# Patient Record
Sex: Male | Born: 1940 | Race: Black or African American | Hispanic: No | Marital: Married | Smoking: Former smoker
Health system: Southern US, Community
[De-identification: ages and names within clinical notes are randomized; demographics above are authoritative.]

## PROBLEM LIST (undated history)

## (undated) ENCOUNTER — Emergency Department (HOSPITAL_COMMUNITY): Payer: Self-pay

## (undated) DIAGNOSIS — E785 Hyperlipidemia, unspecified: Secondary | ICD-10-CM

## (undated) DIAGNOSIS — E119 Type 2 diabetes mellitus without complications: Secondary | ICD-10-CM

## (undated) DIAGNOSIS — R519 Headache, unspecified: Secondary | ICD-10-CM

## (undated) DIAGNOSIS — R55 Syncope and collapse: Secondary | ICD-10-CM

## (undated) DIAGNOSIS — G459 Transient cerebral ischemic attack, unspecified: Secondary | ICD-10-CM

## (undated) DIAGNOSIS — I10 Essential (primary) hypertension: Secondary | ICD-10-CM

---

## 1999-05-28 ENCOUNTER — Emergency Department (HOSPITAL_COMMUNITY): Admission: EM | Admit: 1999-05-28 | Discharge: 1999-05-28 | Payer: Self-pay | Admitting: Internal Medicine

## 1999-06-11 ENCOUNTER — Encounter: Payer: Self-pay | Admitting: Orthopedic Surgery

## 1999-06-11 ENCOUNTER — Ambulatory Visit (HOSPITAL_COMMUNITY): Admission: RE | Admit: 1999-06-11 | Discharge: 1999-06-11 | Payer: Self-pay | Admitting: Orthopedic Surgery

## 2000-06-23 ENCOUNTER — Encounter: Payer: Self-pay | Admitting: Emergency Medicine

## 2000-06-23 ENCOUNTER — Emergency Department (HOSPITAL_COMMUNITY): Admission: EM | Admit: 2000-06-23 | Discharge: 2000-06-23 | Payer: Self-pay | Admitting: Emergency Medicine

## 2000-10-08 ENCOUNTER — Encounter: Payer: Self-pay | Admitting: Family Medicine

## 2000-10-08 ENCOUNTER — Encounter: Admission: RE | Admit: 2000-10-08 | Discharge: 2000-10-08 | Payer: Self-pay | Admitting: Family Medicine

## 2001-11-23 ENCOUNTER — Encounter: Admission: RE | Admit: 2001-11-23 | Discharge: 2002-02-21 | Payer: Self-pay | Admitting: Orthopedic Surgery

## 2002-06-24 ENCOUNTER — Encounter: Admission: RE | Admit: 2002-06-24 | Discharge: 2002-06-24 | Payer: Self-pay | Admitting: Family Medicine

## 2002-06-24 ENCOUNTER — Encounter: Payer: Self-pay | Admitting: Family Medicine

## 2003-09-19 ENCOUNTER — Encounter: Admission: RE | Admit: 2003-09-19 | Discharge: 2003-12-18 | Payer: Self-pay | Admitting: Family Medicine

## 2004-03-08 ENCOUNTER — Inpatient Hospital Stay (HOSPITAL_COMMUNITY): Admission: RE | Admit: 2004-03-08 | Discharge: 2004-03-12 | Payer: Self-pay | Admitting: Orthopedic Surgery

## 2005-05-25 ENCOUNTER — Emergency Department (HOSPITAL_COMMUNITY): Admission: EM | Admit: 2005-05-25 | Discharge: 2005-05-25 | Payer: Self-pay | Admitting: Emergency Medicine

## 2006-05-02 ENCOUNTER — Encounter: Admission: RE | Admit: 2006-05-02 | Discharge: 2006-05-02 | Payer: Self-pay | Admitting: Family Medicine

## 2007-04-29 ENCOUNTER — Inpatient Hospital Stay (HOSPITAL_COMMUNITY): Admission: EM | Admit: 2007-04-29 | Discharge: 2007-05-01 | Payer: Self-pay | Admitting: Emergency Medicine

## 2007-04-30 ENCOUNTER — Encounter (INDEPENDENT_AMBULATORY_CARE_PROVIDER_SITE_OTHER): Payer: Self-pay | Admitting: Internal Medicine

## 2008-01-17 ENCOUNTER — Encounter: Admission: RE | Admit: 2008-01-17 | Discharge: 2008-01-17 | Payer: Self-pay | Admitting: Internal Medicine

## 2009-05-11 ENCOUNTER — Emergency Department (HOSPITAL_COMMUNITY): Admission: EM | Admit: 2009-05-11 | Discharge: 2009-05-11 | Payer: Self-pay | Admitting: Emergency Medicine

## 2010-11-30 LAB — POCT I-STAT, CHEM 8
BUN: 10 mg/dL (ref 6–23)
Hemoglobin: 13.3 g/dL (ref 13.0–17.0)
Potassium: 3.7 mEq/L (ref 3.5–5.1)
Sodium: 138 mEq/L (ref 135–145)
TCO2: 23 mmol/L (ref 0–100)

## 2010-11-30 LAB — CBC
Hemoglobin: 12.3 g/dL — ABNORMAL LOW (ref 13.0–17.0)
MCHC: 33.1 g/dL (ref 30.0–36.0)
RBC: 4.26 MIL/uL (ref 4.22–5.81)
WBC: 3.2 10*3/uL — ABNORMAL LOW (ref 4.0–10.5)

## 2010-11-30 LAB — URINALYSIS, ROUTINE W REFLEX MICROSCOPIC
Glucose, UA: 250 mg/dL — AB
Protein, ur: NEGATIVE mg/dL
Specific Gravity, Urine: 1.007 (ref 1.005–1.030)

## 2010-11-30 LAB — DIFFERENTIAL
Basophils Relative: 0 % (ref 0–1)
Lymphocytes Relative: 33 % (ref 12–46)
Lymphs Abs: 1.1 10*3/uL (ref 0.7–4.0)
Monocytes Relative: 10 % (ref 3–12)
Neutro Abs: 1.7 10*3/uL (ref 1.7–7.7)
Neutrophils Relative %: 54 % (ref 43–77)

## 2010-11-30 LAB — POCT CARDIAC MARKERS
CKMB, poc: <1 ng/mL — ABNORMAL LOW (ref 1.0–8.0)
Myoglobin, poc: 68.9 ng/mL (ref 12–200)

## 2010-11-30 LAB — BASIC METABOLIC PANEL
Calcium: 9.3 mg/dL (ref 8.4–10.5)
GFR calc non Af Amer: >60 mL/min (ref >60)
Potassium: 3.7 mEq/L (ref 3.5–5.1)
Sodium: 139 mEq/L (ref 135–145)

## 2011-01-08 NOTE — H&P (Signed)
NAMESEVE, MONETTE               ACCOUNT NO.:  [id number]   MEDICAL RECORD NO.:  [id number]          PATIENT TYPE:  EMS   LOCATION:  MAJO                         FACILITY:  [facility]   PHYSICIAN:  Elliot Cousin, M.D.    DATE OF BIRTH:  September 16, 1940   DATE OF ADMISSION:  04/29/2007  DATE OF DISCHARGE:                              HISTORY & PHYSICAL   PRIMARY CARE PHYSICIAN:  Unassigned.   CHIEF COMPLAINT:  Abdominal pain, nausea and vomiting.   HISTORY OF PRESENT ILLNESS:  The patient is a 70 year old man with a  past medical history significant for type 2 diabetes mellitus and  hyperlipidemia who presents to the emergency department with a 4-day  history of progressive abdominal pain, nausea and vomiting.  When his  symptoms started, the location of the pain was at the bottom of my  stomach.  He describes the pain as a hurting pain.  The pain  initially was intermittent however, it has been constant over the past  24 hours.  He rates the pain as an 8 or 9 over 10 in intensity at its  worst.  He has had at least two episodes of nausea and vomiting.  He  denies coffee ground emesis or bright red blood in his emesis.  Prior to  today he has eaten very little.  He actually started feeling a little  bit better last night however, during the course of the day he  experienced severe abdominal pain.  He has been able to eat a small  amount of solids and some liquids.  He denies any associated diarrhea,  constipation or painful urination.  No history of bright red blood per  rectum or black tarry stools.  The patient denies fever and chills. He  denies alcohol use or any known history of gallbladder disease.  He  denies any new recent medications.   During the evaluation in the emergency department, the patient is noted  to be bradycardic with a heart rate of 50 beats per minute and otherwise  hemodynamically stable.  He is afebrile.  His lab data are significant  for a lipase of 95 and a  venous glucose of 313.  CT scan of the abdomen  and pelvis reveals no acute abdominal process, left renal calculous non  obstructive, 5 mm, trace of free fluid in the pelvis, and tiny left  inguinal hernia containing only fat.  The patient will be admitted for  further evaluation and management.   PAST MEDICAL HISTORY:  1. Type 2 diabetes mellitus.  2. Hyperlipidemia.  3. 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  4. Postoperative hemorrhage in July of 2005.  5. Excision of a bone spur from the right leg in the past.  6. Right hip pain, results of the MRI as ordered by orthopedic      surgeon, Dr. Renae Fickle, a few days ago are pending.   MEDICATIONS:  1. Glucotrol XL 5 mg daily.  2. Metformin (question dose) 500 mg daily.  3. Lipitor (question dose) once daily.   ALLERGIES:  No known drug allergies.  SOCIAL HISTORY:  The patient is married.  [place]  He is retired from his primary occupation, however,  currently he works part time as a Arts administrator.  He denies alcohol,  tobacco and illicit drug use.  He can read and write a little.   FAMILY HISTORY:  His mother died of Alzheimer's disease in her 7's. His  father died of a stroke at 44 years of age.   REVIEW OF SYSTEMS:  The patient's review of systems is positive for  chronic right hip pain, otherwise review of systems is unremarkable.   PHYSICAL EXAMINATION:  VITAL SIGNS:  Temperature 97.1, blood pressure  122/73, pulse ranging from 50 to 60 beats per minute, respiratory rate  18, oxygen saturation 99% on room air.  GENERAL:  The patient is a pleasant 70 year old African-American man who  is currently sitting up in bed in no acute distress.  HEENT:  Head is normocephalic, atraumatic. Pupils are equal, round and  reactive to light. Extraocular movements are intact. Conjunctivae are  clear, sclerae are white.  Tympanic membranes not examined.  Nasal  mucosa mildly dry, no  sinus tenderness. Oropharynx reveals multiple  missing teeth. Mucous membranes are mildly dry.  No posterior exudates  or erythema.  NECK:  Supple, no adenopathy, no thyromegaly, no bruit, no JVD.  LUNGS:  Clear to auscultation bilaterally.  HEART:  S1, S2 with borderline bradycardia and a soft systolic murmur.  ABDOMEN:  Hypoactive bowel sounds, soft, mild to moderately tender in  the epigastrium and the left lower quadrant. No rebound, no guarding, no  distention, no rigidity.  RECTAL/GU:  Are deferred.  EXTREMITIES:  Pedal pulses palpable.  No pretibial edema and no pedal  edema. Range of motion of the hips is excellent although there is some  discomfort with the right hip internal and external rotations.  NEUROLOGIC:  The patient is alert and oriented x3. Cranial nerves II-XII  are intact. Strength is 5/5 throughout.  Sensation is intact.   ADMISSION LABORATORIES:  EKG reveals sinus bradycardia with a heart rate  of 48 beats per minute with nonspecific ST abnormalities (EKG in  September of 2006 reveals a heart rate of 59 beats per minute and sinus  bradycardia).  Creatinine 1, sodium 135, potassium 3.7, chloride 101,  BUN 17, glucose 313, bicarbonate 28.  WBC 4200, hemoglobin 12.5, platelets 145,000.  Urinalysis: Glucose  greater than 1000, leukocyte esterase negative, wbc's 0-2. AST 19, ALT  23, total protein 6.5, calcium 8.9, lipase 95.   ASSESSMENT:  1. Abdominal pain, nausea and vomiting.  More than likely the      patient's symptoms are secondary to acute pancreatitis.  The      patient's lipase is 95, mild to moderately elevated.  He does not      abuse alcohol and there were no obvious gallstones on the CT scan      of abdomen and pelvis.  The acute pancreatitis may be idiopathic or      secondary to medications.  Will need to rule out      hypertriglyceridemia.  2. Uncontrolled type 2 diabetes mellitus.  3. Normocytic anemia and thrombocytopenia. Etiology unknown at  this      time.  The patient denies bright red blood per rectum and melena.  4. Sinus bradycardia.  The patient has no complaints of chest pain.  A      2D echocardiogram will be ordered for further evaluation.  In  addition, a TSH will be ordered to rule out hypothyroidism.   PLAN:  1. The patient will be admitted for further evaluation and management.  2. He will be made virtually n.p.o. Will allow only sips of water and      ice chips occasionally.  3. Supportive care, IV fluids, pain management with IV Dilaudid as      needed.  4. Prophylactic Protonix and Lovenox.  Will monitor the patient's      platelets on Lovenox.  Will discontinue Lovenox if his platelet      count falls significantly.  5. Check iron studies and guaiac stools.  6. Check cardiac enzymes, TSH, 2D echocardiogram and a fasting lipid      panel.  7. Further evaluation with an ultrasound of the abdomen to rule out      gallstones/gallbladder disease.      Elliot Cousin, M.D.  Electronically Signed     DF/MEDQ  D:  04/29/2007  T:  04/29/2007  Job:  [id number]

## 2011-01-08 NOTE — Discharge Summary (Signed)
[name], [name]               ACCOUNT NO.:  [id number]   MEDICAL RECORD NO.:  [id number]          PATIENT TYPE:  INP   LOCATION:  3705                         FACILITY:  [facility]   PHYSICIAN:  Isidor Holts, M.D.  DATE OF BIRTH:  1941/05/06   DATE OF ADMISSION:  04/29/2007  DATE OF DISCHARGE:  05/01/2007                               DISCHARGE SUMMARY   PMD:  Unassigned.   DISCHARGE DIAGNOSES:  1. Acute pancreatitis.  2. Type 2 diabetes mellitus.  3. Dyslipidemia.  4. Sinus bradycardia.  5. Nonobstructive left renal calculus on abdominal CT scan, April 29, 2007.   DISCHARGE MEDICATIONS:  1. Glucotrol XL 5 mg p.o. daily.  2. Metformin 500 mg p.o. daily.  3. Lipitor 10 mg p.o. daily.  4. Prilosec OTC 20 mg daily for one week only.  5. Darvocet N 100 one p.o. p.r.n. q. 6 hours.  He took 12 of 20 pills      in the past.   PROCEDURES:  1. Abdominopelvic CT scan, dated April 29, 2007.  This showed no      acute abdominal process.  There was a left nonobstructive, 5 mm      renal calculus, free fluid of indeterminate etiology, otherwise no      acute pelvic process.  2. Abdominal ultrasound scan, dated April 30, 2007.  This was a      negative abdominal ultrasound scan.  3. 2D echocardiogram, dated April 30, 2007.  This showed overall      vigorous left ventricular systolic function, LVEF 70%, no left      ventricular regional wall motion abnormalities.  There was an      increased relative contribution of atrial contraction to left      ventricular filling, mild mitral valvular regurgitation.   CONSULTATIONS:  None.   ADMISSION HISTORY:  As in H and P note of April 29, 2007, dictated by  Dr. Elliot Cousin.  However, in brief, this is a 70 year old male, with  known history of type 2 diabetes mellitus, dyslipidemia, osteoarthritis,  status post left total knee replacement, July 2005, who presents with a  four-day history of progressive abdominal pain,  nausea and vomiting.  On  initial evaluation, he was found to have a serum lipase of 95 and blood  glucose of 313.  He was admitted for further evaluation, investigation  and management.   CLINICAL COURSE:  1. Acute pancreatitis:  For details of presentation, refer to      admission history, above.  As noted above, the patient's initiall      lipase level was 95.  He was managed with bowel rest, proton pump      inhibitor treatment, intravenous fluid hydration, and, by April 30, 2007, his lipase level was down to 25, and he had no further      abdominal pain or vomiting.  He was, therefore, advanced to clear      liquids with gradual advancement of diet, by evening of April 30, 2007, was  able to tolerate low-fat diet.  He remained      asymptomatic thereafter.  On May 01, 2007, lipase level was 20      and patient was completely asymptomatic.  Of note, lipid profile      showed no evidence of hypertriglyceridemia and abdominal ultrasound      scan, as well as abdominal CT scan, showed no evidence of      cholelithiasis.  The precise etiology of patient's acute      pancreatitis is unknown.   1. Uncontrolled type 2 diabetes mellitus:  As noted, on initial      presentation, the patient's blood glucose was 313.  This was      managed with sliding scale insulin coverage during the course of      his hospitalization and remained euglycemic.  He has been continued      on pre-admission Glucotrol XL and Glucophage, as well as      carbohydrate modified diet.   1. Sinus bradycardia:  At the time of presentation, patient was noted      to have a sinus bradycardia of 50 per minute, no associated chest      pain.  Electrocardiogram showed no acute ischemic changes.  Cardiac      enzymes were cycled, and remained unelevated.  A 2D echocardiogram      showed vigorous left ventricular function and there was no wall      motion abnormality.  Patient was, therefore, reassured  accordingly.      Incidentally, his TSH was normal at 1.108.   1. Left renal calculus:  This was an incidental finding on abdominal      CT scan of April 29, 2007, which showed a nonobstructive left      renal calculus of 5 mm.  Patient was asymptomatic from this      viewpoint.   1. Dyslipidemia:  The patient's lipid profile was as follows.  Total      cholesterol 183, triglyceride 89, HDL 59, LDL 106, i.e. excellent      lipid profile.  Patient has been reassured accordingly.  He is to      continue pre-admission Lipitor.   DISPOSITION:  Patient was considered sufficiently clinically recovered  and stable to be discharged on April 30, 2007.   DIET:  Low heart-rate modified diet.  Also low fat diet for the next one  week, after which it may be broadened.   FOLLOWUP INSTRUCTIONS:  Patient is recommended to establish primary M.D.  for routine and preventative care.      Isidor Holts, M.D.  Electronically Signed     CO/MEDQ  D:  05/01/2007  T:  05/01/2007  Job:  [id number]

## 2011-01-11 NOTE — Op Note (Signed)
NAME:  [name], [name]                         ACCOUNT NO.:  [id number]   MEDICAL RECORD NO.:  [id number]                   PATIENT TYPE:  INP   LOCATION:  2899                                 FACILITY:  [facility]   PHYSICIAN:  Deidre Ala, M.D.                 DATE OF BIRTH:  02-05-1941   DATE OF PROCEDURE:  03/08/2004  DATE OF DISCHARGE:                                 OPERATIVE REPORT   PREOPERATIVE DIAGNOSIS:  End-stage degenerative joint disease, left knee.   POSTOPERATIVE DIAGNOSIS:  End-stage degenerative joint disease, left knee.   PROCEDURE:  Left total knee arthroplasty using cemented DePuy components,  rotating-platform LCS type.   SURGEON:  Bradley Ferris, M.D.   ASSISTANT:  Madilyn Fireman, P.A.-C.   ANESTHESIA:  General endotracheal with femoral nerve block.   CULTURES:  None.   DRAINS:  Two medium Hemovacs to Autovac.   ESTIMATED BLOOD LOSS:  Less than 100 mL.   BLOOD REPLACED:  Without.   TOURNIQUET TIME:  1 hour 3 minutes.   PATHOLOGIC FINDINGS AND HISTORY:  Amare has been followed for nine months  for bilateral knee osteoarthritis.  He had pain without catching.  He was  not bone on bone but failed Hyalgan and began to have pain with every step,  night pain, and could not walk more than a city block.  Left was worse than  the right.  At surgery today, tricompartment disease.  We fit him to a large  left femur, tibial tray was #5, rotating platform was 12.5, a size large  with equal flexion and extension gap, and a 41 mm patellar button.  He had a  slight flexion contracture preoperatively.  He came to full extension  postoperatively and flexion to about 115 degrees, stable to stressing, with  good patellar tracking postoperatively.   PROCEDURE:  Adequate anesthesia obtained using endotracheal technique after  a femoral nerve block, 1 g Ancef given prophylaxis.  The patient was placed  in the supine position.  The left lower extremity was prepped from the  toes  to the tourniquet in the standard fashion.  After standard prepping and  draping, Esmarch exsanguination was used, and the tourniquet was let up to  350 mmHg.  A median parapatellar skin incision followed by a median  parapatellar retinacular incision.  Incision was deepened sharply with  adequate hemostasis obtained using the Bovie electrocoagulator.  We then  excised the fat pad, both cruciates, and the menisci.  The patella was  everted and the knee flexed.  We then amputated the tibial spine.  A tibial  cutting jig was put in place and tibial cut made with the appropriate  alignment.  We then sized to a large femur, placed the anterior-posterior  cutting jig in place with the 12.5 C-spanner.  We made our anterior-  posterior cut and fit the 12.5 lollipop in place.  We then  put the 4 degree  valgus cutting jig in place, made the distal cut.  It was a little tight for  the 12.5, so I cut 2.5 more and then fit 12.5 on flexion and extension.  Anterior-posterior, chamfer cutting jig was then put in place, then those  cuts made, as well as a far posterior cut.  We then exposed the proximal  tibia, sized to a 5, made the central peg hole and the keel broach.  We then  placed a trial 12.5 tray and the femoral component on and articulated the  knee through a full range of motion.  The patella was then measured to a 30.  We then cut it down with the guide and some free-hand to an 18.5 and then  placed the 41 mm drill guide in place, made those holes, and then placed the  41 mm patellar button and with good tracking noted.  All trial components  were then removed.  The knee was thoroughly jet-lavaged while the components  were checked for sizing as they came on the field.  Cement was then mixed  with two batches of Zinacef, two batches of cement into a cement gun.  We  then cemented on the tibial component, impacted it, and removed excess  cement.  Tibial tray was then put in place.  We then  cemented on the femoral  component, impacted it, removed excess cement, and held it in full  extension, removed excess cement, and then held it in 45 degrees of flexion  while the cement cured.  We then cemented on the patellar component,  impacted it, removed excess cement, and held it with the compression device  until the cement was cured.  Additional jet lavage was carried out.  We then  articulated the knee through a range of motion with good patellar tracking.  The tourniquet was let down, bleeding points were cauterized.  Hemovac  drains were then placed in the medial and lateral gutter and brought out the  superior lateral portal.  The wound was then closed with interrupted #1  Vicryl on the retinaculum, 0 and 2-0 Vicryl on the subcu, and skin staples.  A bulky sterile compressive dressing was applied with knee immobilizer and  the patient then, having tolerated the procedure well, was awakened, taken  to the recovery room in satisfactory condition for routine postoperative  care and CPM.                                               Deidre Ala, M.D.    VEP/MEDQ  D:  03/08/2004  T:  03/08/2004  Job:  [id number]   cc:   Charlynne Pander. Bruna Potter., M.D.

## 2011-01-11 NOTE — Discharge Summary (Signed)
NAME:  [name], [name]                         ACCOUNT NO.:  [id number]   MEDICAL RECORD NO.:  [id number]                   PATIENT TYPE:  INP   LOCATION:  5024                                 FACILITY:  [facility]   PHYSICIAN:  Deidre Ala, M.D.                 DATE OF BIRTH:  [birth year]   DATE OF ADMISSION:  03/08/2004  DATE OF DISCHARGE:  03/12/2004                                 DISCHARGE SUMMARY   ADMISSION DIAGNOSES:  1. Osteoarthritis of the left knee.  2. Diabetes mellitus.  3. Hypercholesterolemia.   DISCHARGE DIAGNOSES:  1. Osteoarthritis of the left knee, status post left total knee     arthroplasty.  2. Diabetes mellitus.  3. Hypercholesterolemia.  4. Postoperative hemorrhage with anemia stable at the time of discharge.   PROCEDURE:  The patient was taken to the operating room on March 08, 2004 and  underwent a left total knee arthroplasty.  Surgeon was Dr. Deidre Ala.  Assistant was Madilyn Fireman, P.A.-C.  Surgery was done under general anesthesia  and 2 Hemovac drains were placed at the time of surgery.   CONSULTS:  1. Physical therapy.  2. Occupational therapy.  3. Social Work.  4. Case Management.   BRIEF HISTORY:  The patient is a 70 year old male who does have a  longstanding history of left knee pain.  He has previously undergone all  conservative management and this has failed, including injections.  Radiographs from the office revealed end-stage degenerative joint disease of  the left knee and upon these findings, Dr. Renae Fickle felt it was best to proceed  with a left total knee arthroplasty; the patient agrees.  The risks and  benefits of the surgery were discussed with the patient and the patient  wishes to proceed.   LABORATORY DATA:  CBC on admission showed a hemoglobin of 12.5, hematocrit  36.6, white blood cell count 3.4, red blood cell count 4.28.  Serial  hemoglobins and hematocrits were followed throughout hospital stay;  hemoglobin and hematocrit  did climb to 7.3 and 21.6 on March 11, 2004,  however, at the time of discharge, they were 9.4 and 27.0 and stable, status  post 2 units of packed red blood cells.  Differential on admission was all  within normal limits.  Coagulation studies on admission were all within  normal limits.  PT/INR at the time of discharge were 14.0 and 1.1,  respectively, on Coumadin therapy.  Routine chemistry on admission was all  within normal limits.  Serial chemistries were taken throughout hospital  stay.  Glucose ranged from a low of 90 at the time of admission to a high of  248, on March 11, 2004, fell back to 223, but remained high and stable on  March 12, 2004, at the time of discharge.  Urinalysis on admission was all  within normal limits.  The patient's blood type is O-positive with antibody  screen negative.   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   HOSPITAL COURSE:  The patient was admitted to [facility] and taken  to the operating room.  Patient underwent the above-stated procedure without  complications.  The patient tolerated the procedure well and allowed to  return to recovery room and orthopedic floor to continue postoperative care.  Postoperative day #1, the patient was doing well, had a T-max of 100,  hemoglobin and hematocrit of 9.6 and 28.2.  The patient was to work with  physical therapy and occupational therapy.  On March 10, 2004, postop day #2,  the patient was doing well, T-max was 99.6, hemoglobin was 8.1, hematocrit  23.7.  Incision was clean, dry and intact.  He was neurovascularly intact to  the left lower extremity.  Hemovac was discontinued on this date; PCA was  also discontinued.  The patient was to continue to work on physical therapy  and occupational therapy.  On March 11, 2004, postop day #3, the patient was  up in chair, doing well.  T-max was 101.3.  Hemoglobin was 7.3 and  hematocrit 21.6, and he was neurovascularly intact to  the left lower  extremity.  He will be transfused 2 units of packed red blood cells on this  date.  He is to continue working with physical therapy and occupational  therapy.  On March 11, 2004, postoperative day #4, the patient was doing  well, was ready for discharge, was neurovascularly intact to the left lower  extremity.  Incision was clean, dry and intact.  The patient was to be  discharged home on this date.   DISPOSITION:  The patient was discharged home on March 12, 2004.   DISCHARGE MEDICATIONS:  1. OxyContin 10 mg 1 p.o. q.12 h. for 1 week.  2. Percocet 5/325 mg 1-2 p.o. q.4-6 h. p.r.n., #50.  3. Robaxin 750 mg 1 p.o. q.6-8 h. p.r.n. spasm.  4. Coumadin per pharmacy protocol.   DIET:  Low-carb, modified diabetic diet.   ACTIVITY:  The patient is weightbearing as tolerated to the left lower  extremity.   WOUND CARE:  Patient to perform daily dressing changes until no drainage.  He may shower when no drainage.   FOLLOWUP:  The patient is to follow up with Dr. Renae Fickle the 27th of July, call  the office for an appointment.   CONDITION ON DISCHARGE:  Condition on discharge is stable and improved.      Clarene Reamer, P.A.-C.                   Deidre Ala, M.D.    SW/MEDQ  D:  04/17/2004  T:  04/17/2004  Job:  [id number]

## 2011-06-07 LAB — COMPREHENSIVE METABOLIC PANEL
ALT: 22
ALT: 23
AST: 22
Alkaline Phosphatase: 38 — ABNORMAL LOW
CO2: 28
CO2: 30
Calcium: 8.8
Calcium: 8.9
Creatinine, Ser: 0.88
GFR calc Af Amer: >60
GFR calc non Af Amer: >60
GFR calc non Af Amer: >60
Glucose, Bld: 105 — ABNORMAL HIGH
Glucose, Bld: 317 — ABNORMAL HIGH
Potassium: 3.3 — ABNORMAL LOW
Sodium: 135
Sodium: 135

## 2011-06-07 LAB — I-STAT 8, (EC8 V) (CONVERTED LAB)
Bicarbonate: 27.9 — ABNORMAL HIGH
Glucose, Bld: 313 — ABNORMAL HIGH
Hemoglobin: 13.6
Sodium: 135
TCO2: 29

## 2011-06-07 LAB — CBC
HCT: 36.7 — ABNORMAL LOW
Hemoglobin: 12.1 — ABNORMAL LOW
Hemoglobin: 12.5 — ABNORMAL LOW
MCHC: 34.2
RBC: 4.18 — ABNORMAL LOW
RBC: 4.4
WBC: 4.2
WBC: 5.1

## 2011-06-07 LAB — COMPREHENSIVE METABOLIC PANEL WITH GFR
AST: 19
Albumin: 3.9
Alkaline Phosphatase: 50
BUN: 16
Chloride: 101
Potassium: 3.7
Total Bilirubin: 0.8
Total Protein: 6.5

## 2011-06-07 LAB — BASIC METABOLIC PANEL
Calcium: 8.4
GFR calc Af Amer: >60
GFR calc non Af Amer: >60
Potassium: 3.6
Sodium: 138

## 2011-06-07 LAB — DIFFERENTIAL
Eosinophils Relative: 0
Lymphocytes Relative: 37
Lymphs Abs: 1.6
Monocytes Absolute: 0.6
Monocytes Relative: 13 — ABNORMAL HIGH

## 2011-06-07 LAB — APTT: aPTT: 24

## 2011-06-07 LAB — LIPID PANEL
LDL Cholesterol: 106 — ABNORMAL HIGH
VLDL: 18

## 2011-06-07 LAB — CARDIAC PANEL(CRET KIN+CKTOT+MB+TROPI)
CK, MB: 0.9
CK, MB: 1.1
Relative Index: 0.8
Relative Index: 1
Total CK: 107

## 2011-06-07 LAB — POCT CARDIAC MARKERS
Operator id: 294521
Troponin i, poc: <0.05

## 2011-06-07 LAB — URINALYSIS, ROUTINE W REFLEX MICROSCOPIC
Glucose, UA: >1000 — AB
Hgb urine dipstick: NEGATIVE
Ketones, ur: 15 — AB
Protein, ur: NEGATIVE

## 2011-06-07 LAB — TROPONIN I: Troponin I: 0.02

## 2011-06-07 LAB — IRON AND TIBC
Iron: 96
Saturation Ratios: 31
TIBC: 312
UIBC: 216

## 2011-06-07 LAB — CK TOTAL AND CKMB (NOT AT ARMC)
CK, MB: 1.3
Relative Index: 0.9

## 2011-06-07 LAB — URINE MICROSCOPIC-ADD ON

## 2011-06-07 LAB — LIPASE, BLOOD: Lipase: 95 — ABNORMAL HIGH

## 2011-06-07 LAB — TSH: TSH: 1.108

## 2011-06-07 LAB — VITAMIN B12: Vitamin B-12: 585 (ref 211–911)

## 2011-06-07 LAB — HEMOGLOBIN A1C: Mean Plasma Glucose: 307

## 2019-09-27 ENCOUNTER — Inpatient Hospital Stay (HOSPITAL_COMMUNITY)
Admission: EM | Admit: 2019-09-27 | Discharge: 2019-09-28 | DRG: 062 | Disposition: A | Discharge status: Home / Self Care | Payer: Medicare HMO | Attending: Neurology | Admitting: Neurology

## 2019-09-27 ENCOUNTER — Inpatient Hospital Stay (HOSPITAL_COMMUNITY): Payer: Medicare HMO

## 2019-09-27 ENCOUNTER — Other Ambulatory Visit: Payer: Self-pay

## 2019-09-27 ENCOUNTER — Emergency Department (HOSPITAL_COMMUNITY): Payer: Medicare HMO

## 2019-09-27 ENCOUNTER — Encounter (HOSPITAL_COMMUNITY): Payer: Self-pay | Admitting: Neurology

## 2019-09-27 DIAGNOSIS — I639 Cerebral infarction, unspecified: Secondary | ICD-10-CM | POA: Diagnosis present

## 2019-09-27 DIAGNOSIS — Z20822 Contact with and (suspected) exposure to covid-19: Secondary | ICD-10-CM | POA: Diagnosis present

## 2019-09-27 DIAGNOSIS — I10 Essential (primary) hypertension: Secondary | ICD-10-CM | POA: Diagnosis present

## 2019-09-27 DIAGNOSIS — Z888 Allergy status to other drugs, medicaments and biological substances status: Secondary | ICD-10-CM

## 2019-09-27 DIAGNOSIS — Z79899 Other long term (current) drug therapy: Secondary | ICD-10-CM | POA: Diagnosis not present

## 2019-09-27 DIAGNOSIS — Z886 Allergy status to analgesic agent status: Secondary | ICD-10-CM

## 2019-09-27 DIAGNOSIS — R29713 NIHSS score 13: Secondary | ICD-10-CM | POA: Diagnosis present

## 2019-09-27 DIAGNOSIS — E785 Hyperlipidemia, unspecified: Secondary | ICD-10-CM | POA: Diagnosis present

## 2019-09-27 DIAGNOSIS — G8194 Hemiplegia, unspecified affecting left nondominant side: Secondary | ICD-10-CM | POA: Diagnosis present

## 2019-09-27 DIAGNOSIS — G450 Vertebro-basilar artery syndrome: Secondary | ICD-10-CM | POA: Diagnosis present

## 2019-09-27 DIAGNOSIS — G459 Transient cerebral ischemic attack, unspecified: Secondary | ICD-10-CM | POA: Diagnosis present

## 2019-09-27 DIAGNOSIS — E1165 Type 2 diabetes mellitus with hyperglycemia: Secondary | ICD-10-CM | POA: Diagnosis present

## 2019-09-27 DIAGNOSIS — I6389 Other cerebral infarction: Secondary | ICD-10-CM | POA: Diagnosis not present

## 2019-09-27 DIAGNOSIS — IMO0002 Reserved for concepts with insufficient information to code with codable children: Secondary | ICD-10-CM | POA: Diagnosis present

## 2019-09-27 LAB — CBC
HCT: 38.6 % — ABNORMAL LOW (ref 39.0–52.0)
Hemoglobin: 12.3 g/dL — ABNORMAL LOW (ref 13.0–17.0)
MCH: 28.2 pg (ref 26.0–34.0)
MCHC: 31.9 g/dL (ref 30.0–36.0)
MCV: 88.5 fL (ref 80.0–100.0)
Platelets: 108 10*3/uL — ABNORMAL LOW (ref 150–400)
RBC: 4.36 MIL/uL (ref 4.22–5.81)
RDW: 11.7 % (ref 11.5–15.5)
WBC: 4.7 10*3/uL (ref 4.0–10.5)
nRBC: 0 % (ref 0.0–0.2)

## 2019-09-27 LAB — URINALYSIS, ROUTINE W REFLEX MICROSCOPIC
Bacteria, UA: NONE SEEN
Bilirubin Urine: NEGATIVE
Glucose, UA: >=500 mg/dL — AB
Hgb urine dipstick: NEGATIVE
Ketones, ur: 5 mg/dL — AB
Leukocytes,Ua: NEGATIVE
Nitrite: NEGATIVE
Protein, ur: NEGATIVE mg/dL
Specific Gravity, Urine: 1.033 — ABNORMAL HIGH (ref 1.005–1.030)
pH: 9 — ABNORMAL HIGH (ref 5.0–8.0)

## 2019-09-27 LAB — DIFFERENTIAL
Abs Immature Granulocytes: 0.02 10*3/uL (ref 0.00–0.07)
Basophils Absolute: 0 10*3/uL (ref 0.0–0.1)
Basophils Relative: 0 %
Eosinophils Absolute: 0.1 10*3/uL (ref 0.0–0.5)
Eosinophils Relative: 2 %
Immature Granulocytes: 0 %
Lymphocytes Relative: 43 %
Lymphs Abs: 2 10*3/uL (ref 0.7–4.0)
Monocytes Absolute: 0.5 10*3/uL (ref 0.1–1.0)
Monocytes Relative: 10 %
Neutro Abs: 2.1 10*3/uL (ref 1.7–7.7)
Neutrophils Relative %: 45 %

## 2019-09-27 LAB — COMPREHENSIVE METABOLIC PANEL
ALT: 16 U/L (ref 0–44)
AST: 21 U/L (ref 15–41)
Albumin: 4 g/dL (ref 3.5–5.0)
Alkaline Phosphatase: 50 U/L (ref 38–126)
Anion gap: 13 (ref 5–15)
BUN: 11 mg/dL (ref 8–23)
CO2: 24 mmol/L (ref 22–32)
Calcium: 9.7 mg/dL (ref 8.9–10.3)
Chloride: 101 mmol/L (ref 98–111)
Creatinine, Ser: 1.16 mg/dL (ref 0.61–1.24)
GFR calc Af Amer: >60 mL/min (ref >60)
GFR calc non Af Amer: >60 mL/min (ref >60)
Glucose, Bld: 268 mg/dL — ABNORMAL HIGH (ref 70–99)
Potassium: 4 mmol/L (ref 3.5–5.1)
Sodium: 138 mmol/L (ref 135–145)
Total Bilirubin: 0.9 mg/dL (ref 0.3–1.2)
Total Protein: 7.4 g/dL (ref 6.5–8.1)

## 2019-09-27 LAB — CBG MONITORING, ED
Glucose-Capillary: 251 mg/dL — ABNORMAL HIGH (ref 70–99)
Glucose-Capillary: 245 mg/dL — ABNORMAL HIGH (ref 70–99)

## 2019-09-27 LAB — PROTIME-INR
INR: 1 (ref 0.8–1.2)
Prothrombin Time: 13.2 seconds (ref 11.4–15.2)

## 2019-09-27 LAB — I-STAT CHEM 8, ED
BUN: 13 mg/dL (ref 8–23)
Calcium, Ion: 1.08 mmol/L — ABNORMAL LOW (ref 1.15–1.40)
Chloride: 102 mmol/L (ref 98–111)
Creatinine, Ser: 1 mg/dL (ref 0.61–1.24)
Glucose, Bld: 258 mg/dL — ABNORMAL HIGH (ref 70–99)
HCT: 38 % — ABNORMAL LOW (ref 39.0–52.0)
Hemoglobin: 12.9 g/dL — ABNORMAL LOW (ref 13.0–17.0)
Potassium: 4 mmol/L (ref 3.5–5.1)
Sodium: 136 mmol/L (ref 135–145)
TCO2: 27 mmol/L (ref 22–32)

## 2019-09-27 LAB — RESPIRATORY PANEL BY RT PCR (FLU A&B, COVID)
Influenza A by PCR: NEGATIVE
Influenza B by PCR: NEGATIVE
SARS Coronavirus 2 by RT PCR: NEGATIVE

## 2019-09-27 LAB — ECHOCARDIOGRAM COMPLETE: Weight: 2758.4 oz

## 2019-09-27 LAB — TROPONIN I (HIGH SENSITIVITY)
Troponin I (High Sensitivity): 3 ng/L (ref <18)
Troponin I (High Sensitivity): 4 ng/L (ref <18)

## 2019-09-27 LAB — APTT: aPTT: 24 seconds (ref 24–36)

## 2019-09-27 LAB — GLUCOSE, CAPILLARY
Glucose-Capillary: 163 mg/dL — ABNORMAL HIGH (ref 70–99)
Glucose-Capillary: 118 mg/dL — ABNORMAL HIGH (ref 70–99)
Glucose-Capillary: 117 mg/dL — ABNORMAL HIGH (ref 70–99)

## 2019-09-27 LAB — HEMOGLOBIN A1C
Hgb A1c MFr Bld: 9.7 % — ABNORMAL HIGH (ref 4.8–5.6)
Mean Plasma Glucose: 231.69 mg/dL

## 2019-09-27 MED ORDER — INSULIN ASPART 100 UNIT/ML ~~LOC~~ SOLN
0.0000 [IU] | Freq: Three times a day (TID) | SUBCUTANEOUS | Status: DC
  Administered 2019-09-27: 8 [IU] via SUBCUTANEOUS

## 2019-09-27 MED ORDER — SENNOSIDES-DOCUSATE SODIUM 8.6-50 MG PO TABS: 1.0000 | ORAL_TABLET | Freq: Every evening | ORAL | Status: DC | PRN

## 2019-09-27 MED ORDER — ALTEPLASE (STROKE) FULL DOSE INFUSION
0.9000 mg/kg | Freq: Once | INTRAVENOUS | Status: AC
  Administered 2019-09-27: 70.4 mg via INTRAVENOUS
  Filled 2019-09-27: qty 100

## 2019-09-27 MED ORDER — SODIUM CHLORIDE 0.9% FLUSH
3.0000 mL | Freq: Once | INTRAVENOUS | Status: AC
  Administered 2019-09-27: 15:00:00 3 mL via INTRAVENOUS

## 2019-09-27 MED ORDER — SODIUM CHLORIDE 0.9 % IV SOLN: INTRAVENOUS | Status: DC

## 2019-09-27 MED ORDER — SODIUM CHLORIDE 0.9 % IV SOLN
50.0000 mL | Freq: Once | INTRAVENOUS | Status: AC
  Administered 2019-09-27: 50 mL via INTRAVENOUS

## 2019-09-27 MED ORDER — INSULIN ASPART 100 UNIT/ML ~~LOC~~ SOLN
4.0000 [IU] | Freq: Three times a day (TID) | SUBCUTANEOUS | Status: DC
  Administered 2019-09-27 – 2019-09-28 (×3): 4 [IU] via SUBCUTANEOUS

## 2019-09-27 MED ORDER — ACETAMINOPHEN 650 MG RE SUPP: 650.0000 mg | RECTAL | Status: DC | PRN

## 2019-09-27 MED ORDER — ACETAMINOPHEN 325 MG PO TABS: 650.0000 mg | ORAL_TABLET | ORAL | Status: DC | PRN

## 2019-09-27 MED ORDER — STROKE: EARLY STAGES OF RECOVERY BOOK
Freq: Once | Status: AC
  Filled 2019-09-27: qty 1

## 2019-09-27 MED ORDER — IOHEXOL 350 MG/ML SOLN
100.0000 mL | Freq: Once | INTRAVENOUS | Status: AC | PRN
  Administered 2019-09-27: 100 mL via INTRAVENOUS

## 2019-09-27 MED ORDER — PANTOPRAZOLE SODIUM 40 MG IV SOLR
40.0000 mg | Freq: Every day | INTRAVENOUS | Status: DC
  Administered 2019-09-27: 40 mg via INTRAVENOUS
  Filled 2019-09-27: qty 40

## 2019-09-27 MED ORDER — ACETAMINOPHEN 160 MG/5ML PO SOLN: 650.0000 mg | ORAL | Status: DC | PRN

## 2019-09-27 NOTE — Progress Notes (Signed)
Patient arrived to 3w-12 at 1214

## 2019-09-27 NOTE — ED Notes (Signed)
Husband speaking with wife over the phone.

## 2019-09-27 NOTE — Progress Notes (Signed)
PHARMACIST CODE STROKE RESPONSE  Notified to mix tPA at 0954 by Dr. Otelia Limes Delivered tPA to RN at [phone]  tPA dose = 7 mg bolus over 1 minute followed by 63.4 mg for a total dose of 70.4 mg over 1 hour  Issues/delays encountered (if applicable):  Initial vial of tPA leaked and required a second tPA to be made and was delivered to RN at 1004  Lawerance Bach 09/27/19 10:18 AM

## 2019-09-27 NOTE — Code Documentation (Signed)
79 yo male coming from home where he lives with his wife. Hx of diabetes with past use of insulin. Reports stopping insulin due to stomach pain. Pt woke up this morning at his baseline at 0800. Pt went to the kitchen with no complaints. At [place] he was sitting at the table eating when he noted to become dizzy and have bilateral weakness L>R. Wife called EMS and EMS activated a Code Stroke. Stroke Team met patient upon arrival to the ED. Labs drawn and EDP Pfeiffer cleared airway at the bridge. Pt taken to CT. CT Head negative for hemorrhage. tPA discussed with patient and patient consented. tPA delayed due to first bottle overflowing at site of primary line. New bottle mixed and brought to bedside. tPA given at 1005 - 7 mg bolus over one minute with remaining 63.4 mg given over one hour. CTA/CTP completed. No LVO detected. Pt returned to ED. Placed on cardiac monitor. tPA assessments continued. Handoff given to Annetta South, Therapist, sports.

## 2019-09-27 NOTE — H&P (Signed)
Referring Physician: Dr. Johnney Killian    Chief Complaint: Acute onset of left sided weakness  HPI: [name] is an 79 y.o. male with DM who presents as a Code Stroke. At 8:40 AM he was eating breakfast and suddenly became diffusely weak. He also complained of acute onset vertigo. Wife called EMS. On arrival, EMS noted that the patient was diaphoretic. CBG was 327. BP 174/96. Exam by EMS revealed left worse than right weakness. On arrival to the ED, the patient was no longer diaphoretic. He endorsed diffuse weakness but did not answer when asked if weakness was worse on one side than the other.   He denies headache, CP or SOB.   LSN: [id number] TOSO: 0840 tPA Given: Yes   PMHx: DM  No family history on file. Social History:  has no history on file for tobacco, alcohol, and drug.  Allergies: Not on File  Medications:  Neurontin  ROS: As per HPI. Comprehensive ROS deferred in the context of acuity of presentation.   Physical Examination: Weight 78.2 kg.  HEENT: [place]/AT Lungs: Tachypneic, breath sounds clear bilaterally Heart: RRR Ext: No edema  Neurologic Examination: Mental Status: Awake, but keeps eyes closed due to dizziness. Oriented x 5. Speech hypophonic but fluent and nondysarthric. Comprehension and naming intact.  Cranial Nerves: II:  Visual fields intact with no extinction to DSS. PERRL.  III,IV, VI: No ptosis. EOMI. No nystagmus.  V,VII: No facial droop. Temp sensation equal bilaterally.  VIII: hearing intact to voice.  IX,X: Hypophonic speech XI: Head is midline XII: midline tongue extension  Motor: Requires extensive encouragement to give effort. Will not initiate elevation of limbs volitionally.  RUE remains elevated for 3 seconds after passively raised, then drops to bed. LUE with minimal effort against gravity, drops to bed almost immediately after being passively raised. RLE drops to bed after 1 second delay. Withdraws briskly with 4/5 strength to plantar  stimulation.  LLE drops to bed immediately after passive elevation.  Withdraws less briskly to plantar stimulation than on the right.  Sensory: Decreased temp sensation to LUE, intact to RUE and BLE. FT intact x 4 with no extinction to DSS.  Deep Tendon Reflexes:  2+ bilateral brachioradialis. 0 patellae and achilles bilaterally.  Plantars: Right: downgoing   Left: downgoing Cerebellar: Unable to perform FNF bilaterally  Gait: Deferred   Results for orders placed or performed during the hospital encounter of 09/27/19 (from the past 48 hour(s))  CBG monitoring, ED     Status: Abnormal   Collection Time: 09/27/19  9:40 AM  Result Value Ref Range   Glucose-Capillary 251 (H) 70 - 99 mg/dL  I-stat chem 8, ED     Status: Abnormal   Collection Time: 09/27/19  9:46 AM  Result Value Ref Range   Sodium 136 135 - 145 mmol/L   Potassium 4.0 3.5 - 5.1 mmol/L   Chloride 102 98 - 111 mmol/L   BUN 13 8 - 23 mg/dL   Creatinine, Ser 1.00 0.61 - 1.24 mg/dL   Glucose, Bld 258 (H) 70 - 99 mg/dL   Calcium, Ion 1.08 (L) 1.15 - 1.40 mmol/L   TCO2 27 22 - 32 mmol/L   Hemoglobin 12.9 (L) 13.0 - 17.0 g/dL   HCT 38.0 (L) 39.0 - 52.0 %   CT HEAD CODE STROKE WO CONTRAST  Result Date: 09/27/2019 CLINICAL DATA:  Code stroke. EXAM: CT HEAD WITHOUT CONTRAST TECHNIQUE: Contiguous axial images were obtained from the base of the skull through the vertex  without intravenous contrast. COMPARISON:  2010 FINDINGS: Brain: There is no acute intracranial hemorrhage, mass effect, or edema. Gray-white differentiation remains preserved. There is no extra-axial fluid collection. Patchy hypoattenuation in the supratentorial white matter is nonspecific but may reflect mild chronic microvascular ischemic changes. Prominence of the ventricles and sulci reflects minor generalized parenchymal volume loss. Vascular: There is no hyperdense vessel. Skull: Unremarkable. Sinuses/Orbits: Patchy mild mucosal thickening. Bilateral lens  replacements. Other: Mastoid air cells are clear. ASPECTS (Alberta Stroke Program Early CT Score) - Ganglionic level infarction (caudate, lentiform nuclei, internal capsule, insula, M1-M3 cortex): 7 - Supraganglionic infarction (M4-M6 cortex): 3 Total score (0-10 with 10 being normal): 10 IMPRESSION: No acute intracranial hemorrhage or evidence of acute infarction. ASPECT score is 10. Mild chronic microvascular ischemic changes. These results were communicated to Dr. Otelia Limes at 9:53 amon 2/1/2021by text page via the [facility] messaging system. Electronically Signed   By: Guadlupe Spanish M.D.   On: 09/27/2019 09:56    Assessment: 79 y.o. male presenting with acute onset of vertigo, left upper extremity sensory deficit and L > R weakness.  1. CT head shows no acute abnormality.ASPECT score is 10. Mild chronic microvascular ischemic changes are noted. 2. Stroke Risk Factors - DM 3. CTA of head and neck: No large vessel occlusion or hemodynamically significant stenosis. 4. CTP: Small area of territory at risk in the lateral right cerebellum by perfusion imaging, which may be artifactual.  Plan: 1. After comprehensive review of possible contraindications, he has no absolute contraindications to tPA administration.The patient is a tPA candidate. Discussed extensively the risks/benefits of tPA treatment vs. no treatment with the patient, including risks of hemorrhage and death with tPA administration versus worse overall outcomes on average in patients within tPA time window who are not administered tPA. Overall benefits of tPA regarding long-term prognosis are felt to outweigh risks. The patient expressed understanding and wish to proceed with tPA. Discussed with patient's wife over the telephone as well.  2. Admitting to Neuro ICU. Post-tPA order set to include frequent neuro checks and BP management.  3. No antiplatelet medications or anticoagulants for at least 24 hours following tPA.  4. DVT prophylaxis with  SCDs.  5. Will need to be started on a statin.  6. Will need to be started on antiplatelet therapy if follow up CT at 24 hours is negative for hemorrhagic conversion. 7. TTE.  9. MRI brain  10. PT/OT/Speech.  11. NPO until passes swallow evaluation.  12. Sliding scale insulin.  13. Telemetry monitoring 14. Fasting lipid panel, HgbA1c  65 minutes spent in the emergent neurological evaluation and management of this critically ill patient.   @Electronically  signed: Dr. 09/27/2019, 9:59 AM

## 2019-09-27 NOTE — ED Notes (Signed)
Pt was eating breakfast at 0840 and acutely limp and diaphoretic.  Generalized weakness and dizziness, greater on the L than the R.  Pt remains ao x 4.

## 2019-09-27 NOTE — ED Provider Notes (Signed)
[facility] EMERGENCY DEPARTMENT Provider Note   CSN: [id number] Arrival date & time: 09/27/19  4332  An emergency department physician performed an initial assessment on this suspected stroke patient at 0936.  History No chief complaint on file.   [name] is a 79 y.o. male.  HPI Patient was eating breakfast with his wife.  At 8:40 AM he suddenly became extremely weak generally.  He could endorse dizziness.  On EMS arrival the patient was noted to be diaphoretic.  His CBG was 327.  Initial BP 174/96.  EMS exam showed left-sided weakness somewhat worse than right.  On arrival, patient has much difficulty providing any history.  He is very confused and uncomfortable appearing.  He is endorsing dizziness.  He does seem to endorse a spinning quality.  He endorses some degree of shortness of breath.  He does not endorse headache or chest pain.  Patient was reinterviewed after TPA.  At this time patient's cognitive function is normal.  He reports he remembers getting up this morning and getting dressed.  He and his wife are making breakfast.  She had made [place] toast and he made eggs.  He had sat down with his plate and taken a bite of sausage and thus the last thing he remembers.  Now, he reports he feels fine.  He is denying any headache.  He does not perceive that he has any right or left-sided weakness.    No past medical history on file.  Patient Active Problem List   Diagnosis Date Noted  . 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  Social History   Tobacco Use  . Smoking status: Not on file  Substance Use Topics  . Alcohol use: Not on file  . Drug use: Not on file    Home Medications Prior to Admission medications   Medication Sig Start Date End Date Taking? Authorizing Provider  amLODipine (NORVASC) 5 MG tablet Take 5 mg by mouth daily. 04/01/19  Yes [provider]  fluticasone (FLONASE) 50 MCG/ACT nasal  spray Place 1 spray into both nostrils daily as needed for allergies. 11/09/18 03/29/23 Yes [provider]  gabapentin (NEURONTIN) 100 MG capsule Take 100 mg by mouth 3 (three) times daily. 06/14/19  Yes [provider]  vitamin B-12 (CYANOCOBALAMIN) 1000 MCG tablet Take 1,000 mcg by mouth daily.   Yes [provider]    Allergies    Lantus [insulin glargine]  Review of Systems   Review of Systems 10 Systems reviewed and are negative for acute change except as noted in the HPI.  Physical Exam Updated Vital Signs BP (!) 154/79   Pulse (!) 59   Resp 13   Wt 78.2 kg   SpO2 100%   Physical Exam Constitutional:      Comments: On arrival, patient was confused and semilethargic.  He appeared very uncomfortable and slightly moaning.  Respirations were deep and slow.  Airway intact.  He is able to follow simple commands.  HENT:     Head: Normocephalic and atraumatic.     Mouth/Throat:     Mouth: Mucous membranes are moist.     Pharynx: Oropharynx is clear.  Eyes:     Comments: Extraocular motions were better assessed after TPA.  At this time now, normal extraocular motions.  Pupils are symmetric about 3 mm and responsive.  Cardiovascular:     Rate and Rhythm: Normal rate and  regular rhythm.  Pulmonary:     Effort: Pulmonary effort is normal.     Breath sounds: Normal breath sounds.     Comments: Pre-TPA patient's respirations were slightly labored.  With mental status clear respirations are nonlabored and clear. Abdominal:     General: There is no distension.     Palpations: Abdomen is soft.     Tenderness: There is no abdominal tenderness. There is no guarding.  Musculoskeletal:        General: No swelling or tenderness. Normal range of motion.     Right lower leg: No edema.     Left lower leg: No edema.  Skin:    General: Skin is warm and dry.  Neurological:     Comments: Pre-TPA exam: Patient is lethargic and confused.  He can follow simple commands.   He will open his mouth.  Tongue appears midline.  He will make effort to grip bilaterally with upper extremities.  I could not discern any distinct difference.  He can answer simple questions such as his name. Post TPA: Patient is alert with clear speech.  Content and cadence are normal.  Cranial nerves intact.  Tongue midline.  Grip strength symmetric.  Patient can elevate each lower extremity off of the bed and hold.     ED Results / Procedures / Treatments   Labs (all labs ordered are listed, but only abnormal results are displayed) Labs Reviewed  CBC - Abnormal; Notable for the following components:      Result Value   Hemoglobin 12.3 (*)    HCT 38.6 (*)    Platelets 108 (*)    All other components within normal limits  COMPREHENSIVE METABOLIC PANEL - Abnormal; Notable for the following components:   Glucose, Bld 268 (*)    All other components within normal limits  I-STAT CHEM 8, ED - Abnormal; Notable for the following components:   Glucose, Bld 258 (*)    Calcium, Ion 1.08 (*)    Hemoglobin 12.9 (*)    HCT 38.0 (*)    All other components within normal limits  CBG MONITORING, ED - Abnormal; Notable for the following components:   Glucose-Capillary 251 (*)    All other components within normal limits  RESPIRATORY PANEL BY RT PCR (FLU A&B, COVID)  PROTIME-INR  APTT  DIFFERENTIAL  HEMOGLOBIN A1C  URINALYSIS, ROUTINE W REFLEX MICROSCOPIC  TROPONIN I (HIGH SENSITIVITY)  TROPONIN I (HIGH SENSITIVITY)    EKG EKG Interpretation  Date/Time:  Monday September 27 2019 10:32:53 EST Ventricular Rate:  72 PR Interval:    QRS Duration: 90 QT Interval:  419 QTC Calculation: 459 R Axis:   75 Text Interpretation: Sinus rhythm Atrial premature complexes artifact and wandering baseline but similar to previous Confirmed by Charlesetta Shanks [id number]) on 09/27/2019 10:42:23 AM   Radiology CT Code Stroke CTA Head W/WO contrast  Result Date: 09/27/2019 CLINICAL DATA:  Generalized weakness  EXAM: CT ANGIOGRAPHY HEAD AND NECK CT PERFUSION BRAIN TECHNIQUE: Multidetector CT imaging of the head and neck was performed using the standard protocol during bolus administration of intravenous contrast. Multiplanar CT image reconstructions and MIPs were obtained to evaluate the vascular anatomy. Carotid stenosis measurements (when applicable) are obtained utilizing NASCET criteria, using the distal internal carotid diameter as the denominator. Multiphase CT imaging of the brain was performed following IV bolus contrast injection. Subsequent parametric perfusion maps were calculated using RAPID software. CONTRAST:  110mL OMNIPAQUE IOHEXOL 350 MG/ML SOLN COMPARISON:  None. FINDINGS: CTA NECK  FINDINGS Aortic arch: Great vessel origins are patent. Right carotid system: Patent. There is no measurable stenosis at the ICA origin. Left carotid system: Patent. There is no measurable stenosis at the ICA origin. Vertebral arteries: Patent. Skeleton: Cervical spine degenerative changes. Other neck: No mass or adenopathy. Upper chest: No apical lung mass. Review of the MIP images confirms the above findings CTA HEAD FINDINGS Anterior circulation: Intracranial internal carotid arteries patent with minor calcified plaque. Anterior and middle cerebral arteries are patent. Posterior circulation: Intracranial vertebral, basilar, and posterior cerebral arteries are patent. Small bilateral posterior communicating arteries are present. Venous sinuses: As permitted by contrast timing, patent. Review of the MIP images confirms the above findings CT Brain Perfusion Findings: CBF (<30%) Volume: 72mL Perfusion (Tmax>6.0s) volume: 28mL in the lateral right cerebellum. Mismatch Volume: 29mL. Infarction Location:None IMPRESSION: No large vessel occlusion or hemodynamically significant stenosis. Small area of territory at risk in the lateral right cerebellum by perfusion imaging, which may be artifactual. Electronically Signed   By: Guadlupe Spanish M.D.   On: 09/27/2019 10:33   CT Code Stroke CTA Neck W/WO contrast  Result Date: 09/27/2019 CLINICAL DATA:  Generalized weakness EXAM: CT ANGIOGRAPHY HEAD AND NECK CT PERFUSION BRAIN TECHNIQUE: Multidetector CT imaging of the head and neck was performed using the standard protocol during bolus administration of intravenous contrast. Multiplanar CT image reconstructions and MIPs were obtained to evaluate the vascular anatomy. Carotid stenosis measurements (when applicable) are obtained utilizing NASCET criteria, using the distal internal carotid diameter as the denominator. Multiphase CT imaging of the brain was performed following IV bolus contrast injection. Subsequent parametric perfusion maps were calculated using RAPID software. CONTRAST:  OMNIPAQUE IOHEXOL 350 MG/ML SOLN COMPARISON:  None. FINDINGS: CTA NECK FINDINGS Aortic arch: Great vessel origins are patent. Right carotid system: Patent. There is no measurable stenosis at the ICA origin. Left carotid system: Patent. There is no measurable stenosis at the ICA origin. Vertebral arteries: Patent. Skeleton: Cervical spine degenerative changes. Other neck: No mass or adenopathy. Upper chest: No apical lung mass. Review of the MIP images confirms the above findings CTA HEAD FINDINGS Anterior circulation: Intracranial internal carotid arteries patent with minor calcified plaque. Anterior and middle cerebral arteries are patent. Posterior circulation: Intracranial vertebral, basilar, and posterior cerebral arteries are patent. Small bilateral posterior communicating arteries are present. Venous sinuses: As permitted by contrast timing, patent. Review of the MIP images confirms the above findings CT Brain Perfusion Findings: CBF (<30%) Volume: 56mL Perfusion (Tmax>6.0s) volume: 71mL in the lateral right cerebellum. Mismatch Volume: 65mL. Infarction Location:None IMPRESSION: No large vessel occlusion or hemodynamically significant stenosis. Small area of  territory at risk in the lateral right cerebellum by perfusion imaging, which may be artifactual. Electronically Signed   By: Guadlupe Spanish M.D.   On: 09/27/2019 10:33   CT Code Stroke Cerebral Perfusion with contrast  Result Date: 09/27/2019 CLINICAL DATA:  Generalized weakness EXAM: CT ANGIOGRAPHY HEAD AND NECK CT PERFUSION BRAIN TECHNIQUE: Multidetector CT imaging of the head and neck was performed using the standard protocol during bolus administration of intravenous contrast. Multiplanar CT image reconstructions and MIPs were obtained to evaluate the vascular anatomy. Carotid stenosis measurements (when applicable) are obtained utilizing NASCET criteria, using the distal internal carotid diameter as the denominator. Multiphase CT imaging of the brain was performed following IV bolus contrast injection. Subsequent parametric perfusion maps were calculated using RAPID software. CONTRAST:  OMNIPAQUE IOHEXOL 350 MG/ML SOLN COMPARISON:  None. FINDINGS: CTA NECK FINDINGS Aortic  arch: Great vessel origins are patent. Right carotid system: Patent. There is no measurable stenosis at the ICA origin. Left carotid system: Patent. There is no measurable stenosis at the ICA origin. Vertebral arteries: Patent. Skeleton: Cervical spine degenerative changes. Other neck: No mass or adenopathy. Upper chest: No apical lung mass. Review of the MIP images confirms the above findings CTA HEAD FINDINGS Anterior circulation: Intracranial internal carotid arteries patent with minor calcified plaque. Anterior and middle cerebral arteries are patent. Posterior circulation: Intracranial vertebral, basilar, and posterior cerebral arteries are patent. Small bilateral posterior communicating arteries are present. Venous sinuses: As permitted by contrast timing, patent. Review of the MIP images confirms the above findings CT Brain Perfusion Findings: CBF (<30%) Volume: 0mL Perfusion (Tmax>6.0s) volume: 3mL in the lateral right  cerebellum. Mismatch Volume: 3mL. Infarction Location:None IMPRESSION: No large vessel occlusion or hemodynamically significant stenosis. Small area of territory at risk in the lateral right cerebellum by perfusion imaging, which may be artifactual. Electronically Signed   By: Guadlupe SpanishPraneil  Patel M.D.   On: 09/27/2019 10:33   CT HEAD CODE STROKE WO CONTRAST  Result Date: 09/27/2019 CLINICAL DATA:  Code stroke. EXAM: CT HEAD WITHOUT CONTRAST TECHNIQUE: Contiguous axial images were obtained from the base of the skull through the vertex without intravenous contrast. COMPARISON:  2010 FINDINGS: Brain: There is no acute intracranial hemorrhage, mass effect, or edema. Gray-white differentiation remains preserved. There is no extra-axial fluid collection. Patchy hypoattenuation in the supratentorial white matter is nonspecific but may reflect mild chronic microvascular ischemic changes. Prominence of the ventricles and sulci reflects minor generalized parenchymal volume loss. Vascular: There is no hyperdense vessel. Skull: Unremarkable. Sinuses/Orbits: Patchy mild mucosal thickening. Bilateral lens replacements. Other: Mastoid air cells are clear. ASPECTS (Alberta Stroke Program Early CT Score) - Ganglionic level infarction (caudate, lentiform nuclei, internal capsule, insula, M1-M3 cortex): 7 - Supraganglionic infarction (M4-M6 cortex): 3 Total score (0-10 with 10 being normal): 10 IMPRESSION: No acute intracranial hemorrhage or evidence of acute infarction. ASPECT score is 10. Mild chronic microvascular ischemic changes. These results were communicated to Dr. Otelia LimesLindzen at 9:53 amon 2/1/2021by text page via the [facility] messaging system. Electronically Signed   By: Guadlupe SpanishPraneil  Patel M.D.   On: 09/27/2019 09:56    Procedures Procedures (including critical care time) CRITICAL CARE Performed by: Arby BarretteMarcy Sabian Kuba   Total critical care time: 15 minutes  Critical care time was exclusive of separately billable procedures and  treating other patients.  Critical care was necessary to treat or prevent imminent or life-threatening deterioration.  Critical care was time spent personally by me on the following activities: development of treatment plan with patient and/or surrogate as well as nursing, discussions with consultants, evaluation of patient's response to treatment, examination of patient, obtaining history from patient or surrogate, ordering and performing treatments and interventions, ordering and review of laboratory studies, ordering and review of radiographic studies, pulse oximetry and re-evaluation of patient's condition. Medications Ordered in ED Medications  sodium chloride flush (NS) 0.9 % injection 3 mL (has no administration in time range)   stroke: mapping our early stages of recovery book (has no administration in time range)  0.9 %  sodium chloride infusion (has no administration in time range)  acetaminophen (TYLENOL) tablet 650 mg (has no administration in time range)    Or  acetaminophen (TYLENOL) 160 MG/5ML solution 650 mg (has no administration in time range)    Or  acetaminophen (TYLENOL) suppository 650 mg (has no administration in time range)  senna-docusate (Senokot-S)  tablet 1 tablet (has no administration in time range)  pantoprazole (PROTONIX) injection 40 mg (has no administration in time range)  insulin aspart (novoLOG) injection 0-15 Units (has no administration in time range)  insulin aspart (novoLOG) injection 4 Units (has no administration in time range)  alteplase (ACTIVASE) 1 mg/mL infusion 70.4 mg (70.4 mg Intravenous New Bag/Given 09/27/19 1005)    Followed by  0.9 %  sodium chloride infusion (50 mLs Intravenous New Bag/Given 09/27/19 1109)  iohexol (OMNIPAQUE) 350 MG/ML injection 100 mL (100 mLs Intravenous Contrast Given 09/27/19 1000)    ED Course  I have reviewed the triage vital signs and the nursing notes.  Pertinent labs & imaging results that were available during my  care of the patient were reviewed by me and considered in my medical decision making (see chart for details).  Clinical Course as of Sep 26 1116  Mon Sep 27, 2019  1110 Recheck: TPA has been started.  Patient has had a dramatic improvement compared to on arrival.  He is now alert and speech is clear with normal content.  He can answer historical questions and follow commands for exam.   [MP]    Clinical Course User Index [MP] Arby Barrette, MD   MDM Rules/Calculators/A&P                     Consult: Code stroke for Dr. Otelia Limes  Patient presents as above as code stroke.  He is initially assessed on arrival and directly to CT with Dr. Otelia Limes managing code stroke.  Dr. Otelia Limes has initiated TPA.  He has excepted the patient for admission.  Patient has made significant improvement after TPA.  Review of systems is negative.  At this time there do not appear to be acute infectious or metabolic issues.  Final Clinical Impression(s) / ED Diagnoses Final diagnoses:  Acute cerebrovascular accident (CVA) [facility])    Rx / DC Orders ED Discharge Orders    None       Arby Barrette, MD 09/27/19 1119

## 2019-09-27 NOTE — Progress Notes (Signed)
  Echocardiogram 2D Echocardiogram has been performed.  Gerda Diss 09/27/2019, 2:03 PM

## 2019-09-27 NOTE — Progress Notes (Signed)
Obtained report from ED RN, patient to be transported to 3w-12

## 2019-09-28 ENCOUNTER — Telehealth: Payer: Self-pay | Admitting: *Deleted

## 2019-09-28 ENCOUNTER — Inpatient Hospital Stay (HOSPITAL_COMMUNITY): Payer: Medicare HMO

## 2019-09-28 ENCOUNTER — Other Ambulatory Visit: Payer: Self-pay | Admitting: Medical

## 2019-09-28 DIAGNOSIS — I639 Cerebral infarction, unspecified: Secondary | ICD-10-CM

## 2019-09-28 DIAGNOSIS — IMO0002 Reserved for concepts with insufficient information to code with codable children: Secondary | ICD-10-CM | POA: Diagnosis present

## 2019-09-28 DIAGNOSIS — I10 Essential (primary) hypertension: Secondary | ICD-10-CM | POA: Diagnosis present

## 2019-09-28 DIAGNOSIS — E1165 Type 2 diabetes mellitus with hyperglycemia: Secondary | ICD-10-CM | POA: Diagnosis present

## 2019-09-28 DIAGNOSIS — E785 Hyperlipidemia, unspecified: Secondary | ICD-10-CM | POA: Diagnosis present

## 2019-09-28 DIAGNOSIS — G459 Transient cerebral ischemic attack, unspecified: Secondary | ICD-10-CM

## 2019-09-28 LAB — GLUCOSE, CAPILLARY
Glucose-Capillary: 105 mg/dL — ABNORMAL HIGH (ref 70–99)
Glucose-Capillary: 98 mg/dL (ref 70–99)

## 2019-09-28 LAB — HEMOGLOBIN A1C
Hgb A1c MFr Bld: 9.7 % — ABNORMAL HIGH (ref 4.8–5.6)
Mean Plasma Glucose: 231.69 mg/dL

## 2019-09-28 LAB — LIPID PANEL
Cholesterol: 222 mg/dL — ABNORMAL HIGH (ref 0–200)
HDL: 74 mg/dL (ref >40)
LDL Cholesterol: 138 mg/dL — ABNORMAL HIGH (ref 0–99)
Total CHOL/HDL Ratio: 3 RATIO
Triglycerides: 51 mg/dL (ref <150)
VLDL: 10 mg/dL (ref 0–40)

## 2019-09-28 MED ORDER — CLOPIDOGREL BISULFATE 75 MG PO TABS: 75.0000 mg | ORAL_TABLET | Freq: Every day | ORAL | 2 refills | Status: DC

## 2019-09-28 MED ORDER — ATORVASTATIN CALCIUM 40 MG PO TABS: 40.0000 mg | ORAL_TABLET | Freq: Every day | ORAL | Status: DC

## 2019-09-28 MED ORDER — ATORVASTATIN CALCIUM 40 MG PO TABS: 40.0000 mg | ORAL_TABLET | Freq: Every day | ORAL | 2 refills | Status: DC

## 2019-09-28 MED ORDER — METFORMIN HCL 850 MG PO TABS
850.0000 mg | ORAL_TABLET | Freq: Every day | ORAL | Status: DC
  Filled 2019-09-28: qty 1

## 2019-09-28 MED ORDER — CLOPIDOGREL BISULFATE 75 MG PO TABS
75.0000 mg | ORAL_TABLET | Freq: Every day | ORAL | Status: DC
  Administered 2019-09-28: 16:00:00 75 mg via ORAL
  Filled 2019-09-28: qty 1

## 2019-09-28 MED ORDER — METFORMIN HCL 850 MG PO TABS: 850.0000 mg | ORAL_TABLET | Freq: Every day | ORAL | 2 refills | Status: DC

## 2019-09-28 NOTE — Discharge Summary (Signed)
Stroke Discharge Summary  Patient ID: Dylan DeedsJesse F Hays    l   MRN: [id number]      DOB: February 18, 1941  Date of Admission: 09/27/2019 Date of Discharge: 09/28/2019  Attending Physician:  Micki RileySethi, Jelisha Weed S, MD, Stroke MD Consultant(s):    None  Patient's PCP:  Patient, No Pcp Per  DISCHARGE DIAGNOSIS:  Principal Problem:   Vertebrobasilar TIA s/p tPA Active Problems:   Essential hypertension   Hyperlipemia   Diabetes mellitus type II, uncontrolled (HCC)   Allergies as of 09/28/2019      Reactions   Aspirin    nausea   Lantus [insulin Glargine] Nausea Only      Medication List    TAKE these medications   amLODipine 5 MG tablet Commonly known as: NORVASC Take 5 mg by mouth daily.   atorvastatin 40 MG tablet Commonly known as: LIPITOR Take 1 tablet (40 mg total) by mouth daily at 6 PM.   clopidogrel 75 MG tablet Commonly known as: PLAVIX Take 1 tablet (75 mg total) by mouth daily.   fluticasone 50 MCG/ACT nasal spray Commonly known as: FLONASE Place 1 spray into both nostrils daily as needed for allergies.   gabapentin 100 MG capsule Commonly known as: NEURONTIN Take 100 mg by mouth 3 (three) times daily.   metFORMIN 850 MG tablet Commonly known as: GLUCOPHAGE Take 1 tablet (850 mg total) by mouth daily with breakfast. Start taking on: September 29, 2019   Systane Balance 0.6 % Soln Generic drug: Propylene Glycol Place 1 drop into both eyes daily as needed (dry eyes).   vitamin B-12 1000 MCG tablet Commonly known as: CYANOCOBALAMIN Take 1,000 mcg by mouth daily.       LABORATORY STUDIES CBC    Component Value Date/Time   WBC 4.7 09/27/2019 0940   RBC 4.36 09/27/2019 0940   HGB 12.9 (L) 09/27/2019 0946   HCT 38.0 (L) 09/27/2019 0946   PLT 108 (L) 09/27/2019 0940   MCV 88.5 09/27/2019 0940   MCH 28.2 09/27/2019 0940   MCHC 31.9 09/27/2019 0940   RDW 11.7 09/27/2019 0940   LYMPHSABS 2.0 09/27/2019 0940   MONOABS 0.5 09/27/2019 0940   EOSABS 0.1 09/27/2019  0940   BASOSABS 0.0 09/27/2019 0940   CMP    Component Value Date/Time   NA 136 09/27/2019 0946   K 4.0 09/27/2019 0946   CL 102 09/27/2019 0946   CO2 24 09/27/2019 0940   GLUCOSE 258 (H) 09/27/2019 0946   BUN 13 09/27/2019 0946   CREATININE 1.00 09/27/2019 0946   CALCIUM 9.7 09/27/2019 0940   PROT 7.4 09/27/2019 0940   ALBUMIN 4.0 09/27/2019 0940   AST 21 09/27/2019 0940   ALT 16 09/27/2019 0940   ALKPHOS 50 09/27/2019 0940   BILITOT 0.9 09/27/2019 0940   GFRNONAA >60 09/27/2019 0940   GFRAA >60 09/27/2019 0940   COAGS Lab Results  Component Value Date   INR 1.0 09/27/2019   INR 1.0 04/29/2007   Lipid Panel    Component Value Date/Time   CHOL 222 (H) 09/28/2019 0514   TRIG 51 09/28/2019 0514   HDL 74 09/28/2019 0514   CHOLHDL 3.0 09/28/2019 0514   VLDL 10 09/28/2019 0514   LDLCALC 138 (H) 09/28/2019 0514   HgbA1C  Lab Results  Component Value Date   HGBA1C 9.7 (H) 09/28/2019   Urinalysis    Component Value Date/Time   COLORURINE STRAW (A) 09/27/2019 1010   APPEARANCEUR CLEAR 09/27/2019 1010   LABSPEC 1.033 (  H) 09/27/2019 1010   PHURINE 9.0 (H) 09/27/2019 1010   GLUCOSEU >=500 (A) 09/27/2019 1010   HGBUR NEGATIVE 09/27/2019 1010   BILIRUBINUR NEGATIVE 09/27/2019 1010   KETONESUR 5 (A) 09/27/2019 1010   PROTEINUR NEGATIVE 09/27/2019 1010   UROBILINOGEN 0.2 05/11/2009 1249   NITRITE NEGATIVE 09/27/2019 1010   LEUKOCYTESUR NEGATIVE 09/27/2019 1010     SIGNIFICANT DIAGNOSTIC STUDIES CT Code Stroke CTA Head W/WO contrast  Result Date: 09/27/2019 CLINICAL DATA:  Generalized weakness EXAM: CT ANGIOGRAPHY HEAD AND NECK CT PERFUSION BRAIN TECHNIQUE: Multidetector CT imaging of the head and neck was performed using the standard protocol during bolus administration of intravenous contrast. Multiplanar CT image reconstructions and MIPs were obtained to evaluate the vascular anatomy. Carotid stenosis measurements (when applicable) are obtained utilizing NASCET  criteria, using the distal internal carotid diameter as the denominator. Multiphase CT imaging of the brain was performed following IV bolus contrast injection. Subsequent parametric perfusion maps were calculated using RAPID software. CONTRAST:  OMNIPAQUE IOHEXOL 350 MG/ML SOLN COMPARISON:  None. FINDINGS: CTA NECK FINDINGS Aortic arch: Great vessel origins are patent. Right carotid system: Patent. There is no measurable stenosis at the ICA origin. Left carotid system: Patent. There is no measurable stenosis at the ICA origin. Vertebral arteries: Patent. Skeleton: Cervical spine degenerative changes. Other neck: No mass or adenopathy. Upper chest: No apical lung mass. Review of the MIP images confirms the above findings CTA HEAD FINDINGS Anterior circulation: Intracranial internal carotid arteries patent with minor calcified plaque. Anterior and middle cerebral arteries are patent. Posterior circulation: Intracranial vertebral, basilar, and posterior cerebral arteries are patent. Small bilateral posterior communicating arteries are present. Venous sinuses: As permitted by contrast timing, patent. Review of the MIP images confirms the above findings CT Brain Perfusion Findings: CBF (<30%) Volume: 0mL Perfusion (Tmax>6.0s) volume: 3mL in the lateral right cerebellum. Mismatch Volume: 3mL. Infarction Location:None IMPRESSION: No large vessel occlusion or hemodynamically significant stenosis. Small area of territory at risk in the lateral right cerebellum by perfusion imaging, which may be artifactual. Electronically Signed   By: Guadlupe Spanish M.D.   On: 09/27/2019 10:33   CT Code Stroke CTA Neck W/WO contrast  Result Date: 09/27/2019 CLINICAL DATA:  Generalized weakness EXAM: CT ANGIOGRAPHY HEAD AND NECK CT PERFUSION BRAIN TECHNIQUE: Multidetector CT imaging of the head and neck was performed using the standard protocol during bolus administration of intravenous contrast. Multiplanar CT image reconstructions  and MIPs were obtained to evaluate the vascular anatomy. Carotid stenosis measurements (when applicable) are obtained utilizing NASCET criteria, using the distal internal carotid diameter as the denominator. Multiphase CT imaging of the brain was performed following IV bolus contrast injection. Subsequent parametric perfusion maps were calculated using RAPID software. CONTRAST:  OMNIPAQUE IOHEXOL 350 MG/ML SOLN COMPARISON:  None. FINDINGS: CTA NECK FINDINGS Aortic arch: Great vessel origins are patent. Right carotid system: Patent. There is no measurable stenosis at the ICA origin. Left carotid system: Patent. There is no measurable stenosis at the ICA origin. Vertebral arteries: Patent. Skeleton: Cervical spine degenerative changes. Other neck: No mass or adenopathy. Upper chest: No apical lung mass. Review of the MIP images confirms the above findings CTA HEAD FINDINGS Anterior circulation: Intracranial internal carotid arteries patent with minor calcified plaque. Anterior and middle cerebral arteries are patent. Posterior circulation: Intracranial vertebral, basilar, and posterior cerebral arteries are patent. Small bilateral posterior communicating arteries are present. Venous sinuses: As permitted by contrast timing, patent. Review of the MIP images confirms the above findings  CT Brain Perfusion Findings: CBF (<30%) Volume: 0mL Perfusion (Tmax>6.0s) volume: 3mL in the lateral right cerebellum. Mismatch Volume: 3mL. Infarction Location:None IMPRESSION: No large vessel occlusion or hemodynamically significant stenosis. Small area of territory at risk in the lateral right cerebellum by perfusion imaging, which may be artifactual. Electronically Signed   By: Guadlupe SpanishPraneil  Patel M.D.   On: 09/27/2019 10:33   MR BRAIN WO CONTRAST  Result Date: 09/28/2019 CLINICAL DATA:  Neuro deficit, acute, stroke suspected. This EXAM: MRI HEAD WITHOUT CONTRAST TECHNIQUE: Multiplanar, multiecho pulse sequences of the brain and  surrounding structures were obtained without intravenous contrast. COMPARISON:  MRI of the brain Jan 17, 2008 FINDINGS: Brain: No acute infarction, hemorrhage, hydrocephalus, extra-axial collection or mass lesion. Small scattered foci of T2 hyperintensity are seen the white matter of the cerebral hemispheres, suggestive of small vessel disease, mildly progressed from prior. Vascular: Normal flow voids. Skull and upper cervical spine: Normal marrow signal. Sinuses/Orbits: Bilateral lens surgery is noted. Mild mucosal thickening is seen the left maxillary antrum. IMPRESSION: 1. No acute intracranial abnormality. 2. Mild chronic microvascular ischemic changes, mildly progressed from prior study. Electronically Signed   By: Baldemar LenisKatyucia  De Macedo Rodrigues M.D.   On: 09/28/2019 11:05   CT Code Stroke Cerebral Perfusion with contrast  Result Date: 09/27/2019 CLINICAL DATA:  Generalized weakness EXAM: CT ANGIOGRAPHY HEAD AND NECK CT PERFUSION BRAIN TECHNIQUE: Multidetector CT imaging of the head and neck was performed using the standard protocol during bolus administration of intravenous contrast. Multiplanar CT image reconstructions and MIPs were obtained to evaluate the vascular anatomy. Carotid stenosis measurements (when applicable) are obtained utilizing NASCET criteria, using the distal internal carotid diameter as the denominator. Multiphase CT imaging of the brain was performed following IV bolus contrast injection. Subsequent parametric perfusion maps were calculated using RAPID software. CONTRAST:  100mL OMNIPAQUE IOHEXOL 350 MG/ML SOLN COMPARISON:  None. FINDINGS: CTA NECK FINDINGS Aortic arch: Great vessel origins are patent. Right carotid system: Patent. There is no measurable stenosis at the ICA origin. Left carotid system: Patent. There is no measurable stenosis at the ICA origin. Vertebral arteries: Patent. Skeleton: Cervical spine degenerative changes. Other neck: No mass or adenopathy. Upper chest: No  apical lung mass. Review of the MIP images confirms the above findings CTA HEAD FINDINGS Anterior circulation: Intracranial internal carotid arteries patent with minor calcified plaque. Anterior and middle cerebral arteries are patent. Posterior circulation: Intracranial vertebral, basilar, and posterior cerebral arteries are patent. Small bilateral posterior communicating arteries are present. Venous sinuses: As permitted by contrast timing, patent. Review of the MIP images confirms the above findings CT Brain Perfusion Findings: CBF (<30%) Volume: 0mL Perfusion (Tmax>6.0s) volume: 3mL in the lateral right cerebellum. Mismatch Volume: 3mL. Infarction Location:None IMPRESSION: No large vessel occlusion or hemodynamically significant stenosis. Small area of territory at risk in the lateral right cerebellum by perfusion imaging, which may be artifactual. Electronically Signed   By: Guadlupe SpanishPraneil  Patel M.D.   On: 09/27/2019 10:33   ECHOCARDIOGRAM COMPLETE  Result Date: 09/27/2019   ECHOCARDIOGRAM REPORT   Patient Name:   Ruben ImJESSE F Aguirre Date of Exam: 09/27/2019 Medical Rec #:  [id number]       Height: Accession #:    [id number]      Weight:       172.4 lb Date of Birth:  February 08, 1941       BSA:          1.83 m Patient Age:    [age] years  BP:           170/125 mmHg Patient Gender: M               HR:           78 bpm. Exam Location:  Inpatient Procedure: 2D Echo Indications:    Stroke 434.91/I163.9  History:        Patient has prior history of Echocardiogram examinations, most                 recent 04/30/2007.  Sonographer:    Ross Ludwig RDCS (AE) Referring Phys: [phone] ERIC LINDZEN IMPRESSIONS  1. Left ventricular ejection fraction, by visual estimation, is 55 to 60%. The left ventricle has normal function. There is no left ventricular hypertrophy.  2. Left ventricular diastolic parameters are consistent with Grade I diastolic dysfunction (impaired relaxation).  3. The left ventricle has no regional wall motion  abnormalities.  4. Global right ventricle has normal systolic function.The right ventricular size is normal.  5. Left atrial size was mildly dilated.  6. Right atrial size was normal.  7. The mitral valve is normal in structure. Trivial mitral valve regurgitation. No evidence of mitral stenosis.  8. The tricuspid valve is normal in structure. Tricuspid valve regurgitation is trivial.  9. The aortic valve is tricuspid. Aortic valve regurgitation is not visualized. Mild aortic valve sclerosis without stenosis. 10. The pulmonic valve was not well visualized. Pulmonic valve regurgitation is not visualized. 11. The inferior vena cava is normal in size with greater than 50% respiratory variability, suggesting right atrial pressure of 3 mmHg. 12. Normal LV systolic function; grade 1 diastolic dysfunction; mild LAE. FINDINGS  Left Ventricle: Left ventricular ejection fraction, by visual estimation, is 55 to 60%. The left ventricle has normal function. The left ventricle has no regional wall motion abnormalities. There is no left ventricular hypertrophy. Left ventricular diastolic parameters are consistent with Grade I diastolic dysfunction (impaired relaxation). Normal left atrial pressure. Right Ventricle: The right ventricular size is normal. . Global RV systolic function is has normal systolic function. Left Atrium: Left atrial size was mildly dilated. Right Atrium: Right atrial size was normal in size Pericardium: There is no evidence of pericardial effusion. Mitral Valve: The mitral valve is normal in structure. Trivial mitral valve regurgitation. No evidence of mitral valve stenosis by observation. MV peak gradient, 2.8 mmHg. Tricuspid Valve: The tricuspid valve is normal in structure. Tricuspid valve regurgitation is trivial. Aortic Valve: The aortic valve is tricuspid. Aortic valve regurgitation is not visualized. Mild aortic valve sclerosis is present, with no evidence of aortic valve stenosis. Aortic valve mean  gradient measures 3.0 mmHg. Aortic valve peak gradient measures 5.1 mmHg. Aortic valve area, by VTI measures 3.59 cm. Pulmonic Valve: The pulmonic valve was not well visualized. Pulmonic valve regurgitation is not visualized. Pulmonic regurgitation is not visualized. Aorta: The aortic root is normal in size and structure. Venous: The inferior vena cava is normal in size with greater than 50% respiratory variability, suggesting right atrial pressure of 3 mmHg. IAS/Shunts: No atrial level shunt detected by color flow Doppler. Additional Comments: Normal LV systolic function; grade 1 diastolic dysfunction; mild LAE.  LEFT VENTRICLE PLAX 2D LVIDd:         4.40 cm  Diastology LVIDs:         3.10 cm  LV e' lateral:   11.40 cm/s LV PW:         1.30 cm  LV E/e' lateral: 4.6 LV IVS:  1.10 cm  LV e' medial:    5.50 cm/s LVOT diam:     2.20 cm  LV E/e' medial:  9.6 LV SV:         50 ml LVOT Area:     3.80 cm  RIGHT VENTRICLE             IVC RV Basal diam:  3.40 cm     IVC diam: 1.20 cm RV Mid diam:    2.30 cm RV S prime:     14.10 cm/s TAPSE (M-mode): 1.9 cm LEFT ATRIUM             Index       RIGHT ATRIUM           Index LA diam:        3.00 cm 1.64 cm/m  RA Area:     15.10 cm LA Vol (A2C):   59.0 ml 32.26 ml/m RA Volume:   38.50 ml  21.05 ml/m LA Vol (A4C):   61.8 ml 33.79 ml/m LA Biplane Vol: 64.3 ml 35.16 ml/m  AORTIC VALVE AV Area (Vmax):    3.73 cm AV Area (Vmean):   2.98 cm AV Area (VTI):     3.59 cm AV Vmax:           113.00 cm/s AV Vmean:          83.500 cm/s AV VTI:            0.214 m AV Peak Grad:      5.1 mmHg AV Mean Grad:      3.0 mmHg LVOT Vmax:         111.00 cm/s LVOT Vmean:        65.400 cm/s LVOT VTI:          0.202 m LVOT/AV VTI ratio: 0.94  AORTA Ao Root diam: 3.40 cm MITRAL VALVE MV Area (PHT): 2.28 cm             SHUNTS MV Peak grad:  2.8 mmHg             Systemic VTI:  0.20 m MV Mean grad:  1.0 mmHg             Systemic Diam: 2.20 cm MV Vmax:       0.83 m/s MV Vmean:      50.6 cm/s MV  VTI:        0.19 m MV PHT:        96.57 msec MV Decel Time: 333 msec MV E velocity: 52.90 cm/s 103 cm/s MV A velocity: 82.50 cm/s 70.3 cm/s MV E/A ratio:  0.64       1.5  Olga Millers MD Electronically signed by Olga Millers MD Signature Date/Time: 09/27/2019/2:59:08 PM    Final    CT HEAD CODE STROKE WO CONTRAST  Result Date: 09/27/2019 CLINICAL DATA:  Code stroke. EXAM: CT HEAD WITHOUT CONTRAST TECHNIQUE: Contiguous axial images were obtained from the base of the skull through the vertex without intravenous contrast. COMPARISON:  2010 FINDINGS: Brain: There is no acute intracranial hemorrhage, mass effect, or edema. Gray-white differentiation remains preserved. There is no extra-axial fluid collection. Patchy hypoattenuation in the supratentorial white matter is nonspecific but may reflect mild chronic microvascular ischemic changes. Prominence of the ventricles and sulci reflects minor generalized parenchymal volume loss. Vascular: There is no hyperdense vessel. Skull: Unremarkable. Sinuses/Orbits: Patchy mild mucosal thickening. Bilateral lens replacements. Other: Mastoid air cells are clear. ASPECTS [facility] Stroke Program Early  CT Score) - Ganglionic level infarction (caudate, lentiform nuclei, internal capsule, insula, M1-M3 cortex): 7 - Supraganglionic infarction (M4-M6 cortex): 3 Total score (0-10 with 10 being normal): 10 IMPRESSION: No acute intracranial hemorrhage or evidence of acute infarction. ASPECT score is 10. Mild chronic microvascular ischemic changes. These results were communicated to Dr. Cheral Marker at 9:53 amon 2/1/2021by text page via the [facility] messaging system. Electronically Signed   By: Macy Mis M.D.   On: 09/27/2019 09:56      HISTORY OF PRESENT ILLNESS [name] is an 79 y.o. male with DM who presents as a Code Stroke. At 8:40 AM on 09/27/2019 ([facility]) - he was eating breakfast and suddenly became diffusely weak. He also complained of acute onset vertigo. Wife called EMS. On  arrival, EMS noted that the patient was diaphoretic. CBG was 327. BP 174/96. Exam by EMS revealed left worse than right weakness. On arrival to the ED, the patient was no longer diaphoretic. He endorsed diffuse weakness but did not answer when asked if weakness was worse on one side than the other. He denies headache, CP or SOB. IV tPA was administered.     HOSPITAL COURSE Mr. LIVINGSTON DENNER is a 79 y.o. male with history of DM presenting with diffuse L>R weakness and vertigo. Received IV tPA 09/27/2019 at 1005.  Posterior circulation TIA s/p tPA likely d/t small vessel disease in setting of uncontrolled glucose. Cannot rule out embolic source given age.   Code Stroke CT head No acute abnormality. Small vessel disease. ASPECTS 10.   CTA head & neck no LVO  CT perfusion small at risk area lateral R cerebellum  MRI  no acute abnormality. Small vessel disease.   2D Echo EF 55-60%. No source of embolus   No antithrombotic prior to admission, recommend clopidogrel 75 mg daily given resported aspirin allergy/intolerance (added to allergy list)  Therapy recommendations:  no therapy needs  OP 30d monitor to look for atrial fibrillation requested  Disposition:  return home with wife  Hypertension  Home meds:  norvasc 5  Stable  BP goal per post tPA protocol x 24h following tPA administration  Resume home meds at d/c. Long-term BP goal normotensive  Hyperlipidemia  Home meds:  No statin  LDL 138  Add lipitor 40  Continue statin at discharge  Diabetes type II Uncontrolled  Home meds:  none listed  HgbA1c 9.7, goal < 7.0  CBGs stable in hospital  GFR > 60  Will add metformin and discharge  Quick and close f/u w/ PCP to optimize glucose control  Other Stroke Risk Factors  Advanced age  DISCHARGE EXAM Blood pressure 126/86, pulse 68, temperature 98.2 F (36.8 C), temperature source Oral, resp. rate 20, height 5\' 8"  (1.727 m), weight 78.2 kg, SpO2 100 %. Pleasant  elderly african [place] male not in distress. . Afebrile. Head is nontraumatic. Neck is supple without bruit.    Cardiac exam no murmur or gallop. Lungs are clear to auscultation. Distal pulses are well felt. Neurological Exam ;  Awake  Alert oriented x 3. Normal speech and language.eye movements full without nystagmus.fundi were not visualized. Vision acuity and fields appear normal. Hearing is normal. Palatal movements are normal. Face symmetric. Tongue midline. Normal strength, tone, reflexes and coordination. Normal sensation. Gait deferred.  Discharge Diet carb modified thin liquids  DISCHARGE PLAN  Disposition:  Return home w/ wife  clopidogrel 75 mg daily for secondary stroke prevention (intolerant to aspirin)  New metformin. Need to follow  up with PCP next week. Get one if you do not have  Ongoing stroke risk factor control by Primary Care Physician at time of discharge  Follow-up in [facility] in 4 weeks, office to schedule an appointment.   35 minutes were spent preparing discharge.  Annie Main, MSN, APRN, ANVP-BC, AGPCNP-BC Advanced Practice Stroke Nurse [facility] See Amion for Schedule & Pager information 09/28/2019 2:35 PM   I have personally obtained history,examined this patient, reviewed notes, independently viewed imaging studies, participated in medical decision making and plan of care.ROS completed by me personally and pertinent positives fully documented  I have made any additions or clarifications directly to the above note. Agree with note above.    Delia Heady, MD Medical Director [facility] Pager: [phone] 09/28/2019 3:51 PM

## 2019-09-28 NOTE — Evaluation (Addendum)
Physical Therapy Evaluation Patient Details Name: [name] MRN: [id number] DOB: 1941-01-09 Today's Date: 09/28/2019   History of Present Illness  79 y.o. male with DM who presents as a Code Stroke. At 8:40 AM he was eating breakfast and suddenly became diffusely weak. Pt received tPA.  Clinical Impression  Pt ambulated 400' without an assistive device, ascended/descended 5 stairs, and completed high level balance assessment with no loss of balance. No deficits with mobility noted. Pt reports his strength and balance have returned to baseline. Pt is ready to DC home from PT standpoint. No further PT indicated, will sign off.     Follow Up Recommendations No PT follow up    Equipment Recommendations  None recommended by PT    Recommendations for Other Services       Precautions / Restrictions Precautions Precautions: None Restrictions Weight Bearing Restrictions: No      Mobility  Bed Mobility Overal bed mobility: Modified Independent             General bed mobility comments: HOB up, used rail  Transfers Overall transfer level: Independent Equipment used: None                Ambulation/Gait Ambulation/Gait assistance: Independent Gait Distance (Feet): 400 Feet Assistive device: None Gait Pattern/deviations: WFL(Within Functional Limits) Gait velocity: WL   General Gait Details: steady with head turns  Stairs Stairs: Yes Stairs assistance: Modified independent (Device/Increase time) Stair Management: One rail Left;Forwards;Alternating pattern Number of Stairs: 5 General stair comments: steady  Wheelchair Mobility    Modified Rankin (Stroke Patients Only) Premorbid 0 Current 0       Balance Overall balance assessment: Independent(no loss of balance with standing with feet together nor with standing with eyes closed)                                           Pertinent Vitals/Pain Pain Assessment: No/denies pain     Home Living Family/patient expects to be discharged to:: Private residence Living Arrangements: Spouse/significant other Available Help at Discharge: Family;Available 24 hours/day Type of Home: House Home Access: Stairs to enter Entrance Stairs-Rails: Right Entrance Stairs-Number of Steps: 4 Home Layout: Two level Home Equipment: Cane - single point;Shower seat Additional Comments: able to stay on the main floor at daughters house.has been staying with daughter since the pandemic    Prior Function Level of Independence: Independent         Comments: was working in [place]: Right    Extremity/Trunk Assessment   Upper Extremity Assessment Upper Extremity Assessment: Defer to OT evaluation    Lower Extremity Assessment Lower Extremity Assessment: Overall WFL for tasks assessed    Cervical / Trunk Assessment Cervical / Trunk Assessment: Normal  Communication   Communication: No difficulties  Cognition Arousal/Alertness: Awake/alert Behavior During Therapy: WFL for tasks assessed/performed Overall Cognitive Status: Within Functional Limits for tasks assessed                                        General Comments      Exercises     Assessment/Plan    PT Assessment Patent does not need any further PT services  PT Problem List  PT Treatment Interventions      PT Goals (Current goals can be found in the Care Plan section)  Acute Rehab PT Goals 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 PT Goal Formulation: All assessment and education complete, DC therapy    Frequency     Barriers to discharge        Co-evaluation PT/OT/SLP Co-Evaluation/Treatment: Yes Reason for Co-Treatment: To address functional/ADL transfers PT goals addressed during session: Mobility/safety with mobility;Balance;Strengthening/ROM         AM-PAC PT "6 Clicks" Mobility  Outcome  Measure Help needed turning from your back to your side while in a flat bed without using bedrails?: None Help needed moving from lying on your back to sitting on the side of a flat bed without using bedrails?: None Help needed moving to and from a bed to a chair (including a wheelchair)?: None Help needed standing up from a chair using your arms (e.g., wheelchair or bedside chair)?: None Help needed to walk in hospital room?: None Help needed climbing 3-5 steps with a railing? : None 6 Click Score: 24    End of Session Equipment Utilized During Treatment: Gait belt Activity Tolerance: Patient tolerated treatment well Patient left: in chair;with call bell/phone within reach Nurse Communication: Mobility status      Time: 3846-6599 PT Time Calculation (min) (ACUTE ONLY): 24 min   Charges:   PT Evaluation $PT Eval Low Complexity: 1 Low          Tamala Ser PT 09/28/2019  Acute Rehabilitation Services Pager [phone] Office [phone]

## 2019-09-28 NOTE — TOC Transition Note (Signed)
Transition of Care [facility]) - CM/SW Discharge Note   Patient Details  Name: [name] MRN: [id number] Date of Birth: Apr 12, 1941  [facility] CM/SW Contact:  Kermit Balo, RN Phone Number: 09/28/2019, 2:57 PM   Clinical Narrative:    Pt discharging home with his spouse who can provide supervision at home.  No f/u per PT/ OT and no DME needs.  Pt has a PCP in [facility] but unable to pronounce his name.  Pts son in law to provide transportation home.    Final next level of care: Home/Self Care Barriers to Discharge: No Barriers Identified   Patient Goals and CMS Choice        Discharge Placement                       Discharge Plan and Services                                     Social Determinants of Health (SDOH) Interventions     Readmission Risk Interventions No flowsheet data found.

## 2019-09-28 NOTE — Plan of Care (Signed)
Min assist with adls 

## 2019-09-28 NOTE — Telephone Encounter (Addendum)
Patient enrolled for [employer] to ship a 30 day cardiac event monitor to [address], [place], [place] [address], (temporary address/ daughters home).  Instructions reviewed briefly with Mrs. [name].  Asked to list daughter, [name], [phone] and son in law [name], [place] as emergency contacts

## 2019-09-28 NOTE — Progress Notes (Signed)
SLP Cancellation Note  Patient Details Name: [name] MRN: [id number] DOB: April 19, 1941   Cancelled treatment:       Reason Eval/Treat Not Completed: Patient at procedure or test/unavailable  ST attempted SLE. Patient not available. ST will re-attempt later this date as schedule allows.  Shella Spearing, M.Ed., CCC-SLP Speech Therapy Acute Rehabilitation [phone]: Acute Rehab office [phone] - pager   Shella Spearing 09/28/2019, 10:19 AM

## 2019-09-28 NOTE — Progress Notes (Signed)
Occupational Therapy Evaluation Patient Details Name: [name] MRN: [id number] DOB: 1941/01/30 Today's Date: 09/28/2019    History of Present Illness 79 y.o. male with DM who presents as a Code Stroke. At 8:40 AM he was eating breakfast and suddenly became diffusely weak. Pt received tPA.   Clinical Impression   PTA, pt was independent with ADL/IADL and functional mobility he was still driving and enjoyed Fish farm manager. Pt demonstrated ability to complete ADL at modified independent - independent level. Pt reports he is back to baseline. He reported numbness in right hand digits 2-5 from MCP joint to fingertips, report he has discussed with MD, appears they discussed possible carpal tunnel release. Patient evaluated by Occupational Therapy with no further acute OT needs identified. All education has been completed and the patient has no further questions. See below for any follow-up Occupational Therapy or equipment needs. OT to sign off. Thank you for referral.      Follow Up Recommendations  No OT follow up    Equipment Recommendations  None recommended by OT    Recommendations for Other Services       Precautions / Restrictions Precautions Precautions: None Restrictions Weight Bearing Restrictions: No      Mobility Bed Mobility Overal bed mobility: Modified Independent             General bed mobility comments: HOB up, used rail  Transfers Overall transfer level: Independent Equipment used: None                  Balance Overall balance assessment: Independent(no loss of balance with standing with feet together nor with standing with eyes closed)                                         ADL either performed or assessed with clinical judgement   ADL Overall ADL's : Modified independent                                       General ADL Comments: pt demonstrated ability to complete ADL at modified independent  level requiring increased time      Vision Baseline Vision/History: Wears glasses Wears Glasses: Reading only Patient Visual Report: No change from baseline Vision Assessment?: No apparent visual deficits     Perception     Praxis      Pertinent Vitals/Pain Pain Assessment: No/denies pain     Hand Dominance Right   Extremity/Trunk Assessment Upper Extremity Assessment Upper Extremity Assessment: RUE deficits/detail RUE Deficits / Details: reports numbness in digits 2-5 MCP to fingertip;reports he has seen MD about this and discussed carpal tunnel release;otherwise WFL RUE Sensation: decreased light touch RUE Coordination: WNL   Lower Extremity Assessment Lower Extremity Assessment: Defer to PT evaluation   Cervical / Trunk Assessment Cervical / Trunk Assessment: Normal   Communication Communication Communication: No difficulties   Cognition Arousal/Alertness: Awake/alert Behavior During Therapy: WFL for tasks assessed/performed Overall Cognitive Status: Within Functional Limits for tasks assessed                                     General Comments  orthostatic BP taken, see vitals section    Exercises     Shoulder Instructions  Home Living Family/patient expects to be discharged to:: Private residence Living Arrangements: Spouse/significant other Available Help at Discharge: Family;Available 24 hours/day Type of Home: House Home Access: Stairs to enter [employer] of Steps: 4 Entrance Stairs-Rails: Right Home Layout: Two level   Alternate Level Stairs-Rails: Right Bathroom Shower/Tub: Producer, television/film/video: Standard     Home Equipment: Cane - single point;Shower seat   Additional Comments: able to stay on the main floor at daughters house.has been staying with daughter since the pandemic      Prior Functioning/Environment Level of Independence: Independent        Comments: was working in [employer] Problem List: Decreased knowledge of precautions      OT Treatment/Interventions:      OT Goals(Current goals can be found in the care plan section) Acute Rehab OT Goals 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 OT Goal Formulation: With patient Time For Goal Achievement: 10/12/19 Potential to Achieve Goals: Good  OT Frequency:     Barriers to D/C:            Co-evaluation PT/OT/SLP Co-Evaluation/Treatment: Yes Reason for Co-Treatment: For patient/therapist safety;To address functional/ADL transfers PT goals addressed during session: Mobility/safety with mobility;Balance;Strengthening/ROM OT goals addressed during session: ADL's and self-care      AM-PAC OT "6 Clicks" Daily Activity     Outcome Measure Help from another person eating meals?: None Help from another person taking care of personal grooming?: None Help from another person toileting, which includes using toliet, bedpan, or urinal?: None Help from another person bathing (including washing, rinsing, drying)?: None Help from another person to put on and taking off regular upper body clothing?: None Help from another person to put on and taking off regular lower body clothing?: None 6 Click Score: 24   End of Session Equipment Utilized During Treatment: Gait belt Nurse Communication: Mobility status  Activity Tolerance: Patient tolerated treatment well Patient left: in chair;with call bell/phone within reach  OT Visit Diagnosis: Other abnormalities of gait and mobility (R26.89)                Time: 4401-0272 OT Time Calculation (min): 27 min Charges:  OT General Charges $OT Visit: 1 Visit OT Evaluation $OT Eval Low Complexity: 1 Low  Diona Browner OTR/L Acute Rehabilitation Services Office: [phone]   Rebeca Alert 09/28/2019, 1:26 PM

## 2019-09-29 ENCOUNTER — Other Ambulatory Visit: Payer: Self-pay | Admitting: Medical

## 2019-09-29 DIAGNOSIS — I639 Cerebral infarction, unspecified: Secondary | ICD-10-CM

## 2019-09-29 NOTE — Progress Notes (Signed)
   Patient admitted with a CVA. Cardiology asked to order a 30-day event monitor. Order placed and message sent to Omar Person to assist.  Beatriz Stallion, PA-C 09/29/19; 6:22 AM

## 2019-10-04 ENCOUNTER — Ambulatory Visit (INDEPENDENT_AMBULATORY_CARE_PROVIDER_SITE_OTHER): Payer: Medicare HMO

## 2019-10-04 DIAGNOSIS — I4891 Unspecified atrial fibrillation: Secondary | ICD-10-CM | POA: Diagnosis not present

## 2019-10-04 DIAGNOSIS — G459 Transient cerebral ischemic attack, unspecified: Secondary | ICD-10-CM

## 2019-10-04 DIAGNOSIS — I639 Cerebral infarction, unspecified: Secondary | ICD-10-CM | POA: Diagnosis not present

## 2019-10-25 ENCOUNTER — Ambulatory Visit: Payer: Medicare HMO | Attending: Internal Medicine

## 2019-10-25 DIAGNOSIS — Z23 Encounter for immunization: Secondary | ICD-10-CM | POA: Insufficient documentation

## 2019-10-25 NOTE — Progress Notes (Signed)
   Covid-19 Vaccination Clinic  Name:  [name]    MRN: [id number] DOB: 1941/01/11  10/25/2019  [name] was observed post Covid-19 immunization for 15 minutes without incidence. He was provided with Vaccine Information Sheet and instruction to access the V-Safe system.   [name] was instructed to call 911 with any severe reactions post vaccine: Marland Kitchen Difficulty breathing  . Swelling of your face and throat  . A fast heartbeat  . A bad rash all over your body  . Dizziness and weakness    Immunizations Administered    Name Date Dose VIS Date Route   Pfizer COVID-19 Vaccine 10/25/2019 10:50 AM 0.3 mL 08/06/2019 Intramuscular   Manufacturer: [employer], [employer]   Lot: [id number]   NDC: [address]-8

## 2019-11-10 ENCOUNTER — Other Ambulatory Visit: Payer: Self-pay | Admitting: Medical

## 2019-11-10 DIAGNOSIS — I639 Cerebral infarction, unspecified: Secondary | ICD-10-CM

## 2019-11-10 DIAGNOSIS — G459 Transient cerebral ischemic attack, unspecified: Secondary | ICD-10-CM

## 2019-11-10 DIAGNOSIS — I4891 Unspecified atrial fibrillation: Secondary | ICD-10-CM

## 2019-11-13 ENCOUNTER — Other Ambulatory Visit: Payer: Self-pay

## 2019-11-13 ENCOUNTER — Emergency Department (HOSPITAL_BASED_OUTPATIENT_CLINIC_OR_DEPARTMENT_OTHER): Payer: Medicare HMO

## 2019-11-13 ENCOUNTER — Emergency Department (HOSPITAL_BASED_OUTPATIENT_CLINIC_OR_DEPARTMENT_OTHER)
Admission: EM | Admit: 2019-11-13 | Discharge: 2019-11-13 | Disposition: A | Discharge status: Home / Self Care | Payer: Medicare HMO | Attending: Emergency Medicine | Admitting: Emergency Medicine

## 2019-11-13 ENCOUNTER — Encounter (HOSPITAL_BASED_OUTPATIENT_CLINIC_OR_DEPARTMENT_OTHER): Payer: Self-pay | Admitting: *Deleted

## 2019-11-13 DIAGNOSIS — M5432 Sciatica, left side: Secondary | ICD-10-CM | POA: Insufficient documentation

## 2019-11-13 DIAGNOSIS — Z7984 Long term (current) use of oral hypoglycemic drugs: Secondary | ICD-10-CM | POA: Diagnosis not present

## 2019-11-13 DIAGNOSIS — E1165 Type 2 diabetes mellitus with hyperglycemia: Secondary | ICD-10-CM | POA: Insufficient documentation

## 2019-11-13 DIAGNOSIS — Z7902 Long term (current) use of antithrombotics/antiplatelets: Secondary | ICD-10-CM | POA: Insufficient documentation

## 2019-11-13 DIAGNOSIS — Z87891 Personal history of nicotine dependence: Secondary | ICD-10-CM | POA: Diagnosis not present

## 2019-11-13 DIAGNOSIS — R1032 Left lower quadrant pain: Secondary | ICD-10-CM | POA: Diagnosis present

## 2019-11-13 DIAGNOSIS — Z79899 Other long term (current) drug therapy: Secondary | ICD-10-CM | POA: Diagnosis not present

## 2019-11-13 DIAGNOSIS — R739 Hyperglycemia, unspecified: Secondary | ICD-10-CM

## 2019-11-13 DIAGNOSIS — I10 Essential (primary) hypertension: Secondary | ICD-10-CM | POA: Insufficient documentation

## 2019-11-13 HISTORY — DX: Type 2 diabetes mellitus without complications: E11.9

## 2019-11-13 HISTORY — DX: Transient cerebral ischemic attack, unspecified: G45.9

## 2019-11-13 HISTORY — DX: Essential (primary) hypertension: I10

## 2019-11-13 LAB — COMPREHENSIVE METABOLIC PANEL
ALT: 13 U/L (ref 0–44)
AST: 18 U/L (ref 15–41)
Albumin: 4.2 g/dL (ref 3.5–5.0)
Alkaline Phosphatase: 65 U/L (ref 38–126)
Anion gap: 10 (ref 5–15)
BUN: 11 mg/dL (ref 8–23)
CO2: 25 mmol/L (ref 22–32)
Calcium: 9.3 mg/dL (ref 8.9–10.3)
Chloride: 97 mmol/L — ABNORMAL LOW (ref 98–111)
Creatinine, Ser: 0.93 mg/dL (ref 0.61–1.24)
GFR calc Af Amer: >60 mL/min (ref >60)
GFR calc non Af Amer: >60 mL/min (ref >60)
Glucose, Bld: 337 mg/dL — ABNORMAL HIGH (ref 70–99)
Potassium: 3.8 mmol/L (ref 3.5–5.1)
Sodium: 132 mmol/L — ABNORMAL LOW (ref 135–145)
Total Bilirubin: 0.5 mg/dL (ref 0.3–1.2)
Total Protein: 7.7 g/dL (ref 6.5–8.1)

## 2019-11-13 LAB — LIPASE, BLOOD: Lipase: 84 U/L — ABNORMAL HIGH (ref 11–51)

## 2019-11-13 LAB — CBC WITH DIFFERENTIAL/PLATELET
Abs Immature Granulocytes: 0.01 10*3/uL (ref 0.00–0.07)
Basophils Absolute: 0 10*3/uL (ref 0.0–0.1)
Basophils Relative: 0 %
Eosinophils Absolute: 0 10*3/uL (ref 0.0–0.5)
Eosinophils Relative: 1 %
HCT: 37.4 % — ABNORMAL LOW (ref 39.0–52.0)
Hemoglobin: 12.4 g/dL — ABNORMAL LOW (ref 13.0–17.0)
Immature Granulocytes: 0 %
Lymphocytes Relative: 37 %
Lymphs Abs: 1.6 10*3/uL (ref 0.7–4.0)
MCH: 28.2 pg (ref 26.0–34.0)
MCHC: 33.2 g/dL (ref 30.0–36.0)
MCV: 85.2 fL (ref 80.0–100.0)
Monocytes Absolute: 0.5 10*3/uL (ref 0.1–1.0)
Monocytes Relative: 11 %
Neutro Abs: 2.1 10*3/uL (ref 1.7–7.7)
Neutrophils Relative %: 51 %
Platelets: 143 10*3/uL — ABNORMAL LOW (ref 150–400)
RBC: 4.39 MIL/uL (ref 4.22–5.81)
RDW: 11.8 % (ref 11.5–15.5)
WBC: 4.2 10*3/uL (ref 4.0–10.5)
nRBC: 0 % (ref 0.0–0.2)

## 2019-11-13 LAB — URINALYSIS, ROUTINE W REFLEX MICROSCOPIC
Bilirubin Urine: NEGATIVE
Glucose, UA: >=500 mg/dL — AB
Hgb urine dipstick: NEGATIVE
Ketones, ur: NEGATIVE mg/dL
Leukocytes,Ua: NEGATIVE
Nitrite: NEGATIVE
Protein, ur: NEGATIVE mg/dL
Specific Gravity, Urine: 1.025 (ref 1.005–1.030)
pH: 5.5 (ref 5.0–8.0)

## 2019-11-13 LAB — URINALYSIS, MICROSCOPIC (REFLEX)

## 2019-11-13 MED ORDER — HYDROCODONE-ACETAMINOPHEN 5-325 MG PO TABS: 1.0000 | ORAL_TABLET | ORAL | 0 refills | Status: DC | PRN

## 2019-11-13 MED ORDER — ONDANSETRON HCL 4 MG/2ML IJ SOLN
4.0000 mg | Freq: Once | INTRAMUSCULAR | Status: AC
  Administered 2019-11-13: 4 mg via INTRAVENOUS
  Filled 2019-11-13: qty 2

## 2019-11-13 MED ORDER — POLYETHYLENE GLYCOL 3350 17 G PO PACK: 17.0000 g | PACK | Freq: Every day | ORAL | 0 refills | Status: DC

## 2019-11-13 MED ORDER — SODIUM CHLORIDE 0.9 % IV BOLUS
500.0000 mL | Freq: Once | INTRAVENOUS | Status: AC
  Administered 2019-11-13: 500 mL via INTRAVENOUS

## 2019-11-13 MED ORDER — MORPHINE SULFATE (PF) 4 MG/ML IV SOLN
4.0000 mg | Freq: Once | INTRAVENOUS | Status: AC
  Administered 2019-11-13: 4 mg via INTRAVENOUS
  Filled 2019-11-13: qty 1

## 2019-11-13 NOTE — Discharge Instructions (Addendum)
Take miralax while on hydrocodone as the hydrocodone will cause more constipation.

## 2019-11-13 NOTE — ED Triage Notes (Addendum)
Pt c/o left flank pain radiating to LLQ and down into his left thigh since Wednesday. Denies n/v/hematuria/falls/known injury

## 2019-11-13 NOTE — ED Provider Notes (Signed)
[facility] EMERGENCY DEPARTMENT Provider Note   CSN: [id number] Arrival date & time: 11/13/19  1953     History Chief Complaint  Patient presents with  . Flank Pain    [name] is a 79 y.o. male.  Pt presents to the ED today with left flank pain radiating into his left lower abdomen and down his left leg down to his knee.  Pt denies any n/v.  No dysuria or hematuria.  He said it's been going on for about 3 days, but last night was the worst.  He was unable to sleep.  Pt denies f/c.  No falls.        Past Medical History:  Diagnosis Date  . Diabetes mellitus without complication (HCC)   . Hypertension   . TIA (transient ischemic attack)     Patient Active Problem List   Diagnosis Date Noted  . Essential hypertension 09/28/2019  . Hyperlipemia 09/28/2019  . Diabetes mellitus type II, uncontrolled (HCC) 09/28/2019  . 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 No pertinent surgical history.     No family history on file.  Social History   Tobacco Use  . Smoking status: Former Games developer  . Smokeless tobacco: Never Used  . Tobacco comment: pt quit 53 yrs ago  Substance Use Topics  . Alcohol use: Never  . Drug use: Never    Home Medications Prior to Admission medications   Medication Sig Start Date End Date Taking? Authorizing Provider  atorvastatin (LIPITOR) 40 MG tablet Take 1 tablet (40 mg total) by mouth daily at 6 PM. 09/28/19  Yes Layne Benton, NP  metFORMIN (GLUCOPHAGE) 850 MG tablet Take 1 tablet (850 mg total) by mouth daily with breakfast. 09/29/19  Yes Layne Benton, NP  tamsulosin (FLOMAX) 0.4 MG CAPS capsule Take 0.4 mg by mouth.   Yes [provider]  amLODipine (NORVASC) 5 MG tablet Take 5 mg by mouth daily. 04/01/19   [provider]  clopidogrel (PLAVIX) 75 MG tablet Take 1 tablet (75 mg total) by mouth daily. 09/28/19   Layne Benton, NP  clopidogrel (PLAVIX) 75 MG tablet Take by mouth. 10/19/19    [provider]  fluticasone (FLONASE) 50 MCG/ACT nasal spray Place 1 spray into both nostrils daily as needed for allergies. 11/09/18 03/29/23  [provider]  gabapentin (NEURONTIN) 100 MG capsule Take 100 mg by mouth 3 (three) times daily. 06/14/19   [provider]  HYDROcodone-acetaminophen (NORCO/VICODIN) 5-325 MG tablet Take 1 tablet by mouth every 4 (four) hours as needed. 11/13/19   Jacalyn Lefevre, MD  MAGNESIUM-OXIDE 400 (241.3 Mg) MG tablet Take 1 tablet by mouth daily. 10/28/19   [provider]  polyethylene glycol (MIRALAX) 17 g packet Take 17 g by mouth daily. 11/13/19   Jacalyn Lefevre, MD  Propylene Glycol (SYSTANE BALANCE) 0.6 % SOLN Place 1 drop into both eyes daily as needed (dry eyes).    [provider]  vitamin B-12 (CYANOCOBALAMIN) 1000 MCG tablet Take 1,000 mcg by mouth daily.    [provider]  Vitamin D, Ergocalciferol, (DRISDOL) 1.25 MG (50000 UNIT) CAPS capsule Take 50,000 Units by mouth once a week. 10/28/19   [provider]    Allergies    Aspirin, Atorvastatin, Lantus [insulin glargine], and Metformin  Review of Systems   Review of Systems  Genitourinary: Positive for flank pain.  All other systems reviewed and are negative.   Physical Exam Updated Vital  Signs BP (!) 182/97 (BP Location: Right Arm)   Pulse 75   Temp 98.1 F (36.7 C) (Oral)   Resp 16   Ht 5\' 9"  (1.753 m)   Wt 72.6 kg   SpO2 99%   BMI 23.63 kg/m   Physical Exam Vitals and nursing note reviewed.  Constitutional:      Appearance: Normal appearance.  HENT:     Head: Normocephalic and atraumatic.     Right Ear: External ear normal.     Left Ear: External ear normal.     Nose: Nose normal.     Mouth/Throat:     Mouth: Mucous membranes are moist.     Pharynx: Oropharynx is clear.  Eyes:     Extraocular Movements: Extraocular movements intact.     Conjunctiva/sclera: Conjunctivae normal.     Pupils: Pupils are equal,  round, and reactive to light.  Cardiovascular:     Rate and Rhythm: Normal rate and regular rhythm.     Pulses: Normal pulses.     Heart sounds: Normal heart sounds.  Pulmonary:     Effort: Pulmonary effort is normal.     Breath sounds: Normal breath sounds.  Abdominal:     General: Abdomen is flat. Bowel sounds are normal.     Palpations: Abdomen is soft.     Tenderness: There is abdominal tenderness in the suprapubic area and left lower quadrant.  Musculoskeletal:        General: Normal range of motion.     Cervical back: Normal range of motion and neck supple.  Skin:    General: Skin is warm.     Capillary Refill: Capillary refill takes less than 2 seconds.  Neurological:     General: No focal deficit present.     Mental Status: He is alert and oriented to person, place, and time.  Psychiatric:        Mood and Affect: Mood normal.        Behavior: Behavior normal.     ED Results / Procedures / Treatments   Labs (all labs ordered are listed, but only abnormal results are displayed) Labs Reviewed  CBC WITH DIFFERENTIAL/PLATELET - Abnormal; Notable for the following components:      Result Value   Hemoglobin 12.4 (*)    HCT 37.4 (*)    Platelets 143 (*)    All other components within normal limits  COMPREHENSIVE METABOLIC PANEL - Abnormal; Notable for the following components:   Sodium 132 (*)    Chloride 97 (*)    Glucose, Bld 337 (*)    All other components within normal limits  LIPASE, BLOOD - Abnormal; Notable for the following components:   Lipase 84 (*)    All other components within normal limits  URINALYSIS, ROUTINE W REFLEX MICROSCOPIC - Abnormal; Notable for the following components:   Glucose, UA >=500 (*)    All other components within normal limits  URINALYSIS, MICROSCOPIC (REFLEX) - Abnormal; Notable for the following components:   Bacteria, UA RARE (*)    All other components within normal limits    EKG None  Radiology CT RENAL STONE  STUDY  Result Date: 11/13/2019 CLINICAL DATA:  Left flank pain EXAM: CT ABDOMEN AND PELVIS WITHOUT CONTRAST TECHNIQUE: Multidetector CT imaging of the abdomen and pelvis was performed following the standard protocol without IV contrast. COMPARISON:  None. FINDINGS: Lower chest: Lung bases are clear. No effusions. Heart is normal size. Hepatobiliary: No focal hepatic abnormality. Gallbladder unremarkable. Pancreas: No  focal abnormality or ductal dilatation. Spleen: No focal abnormality.  Normal size. Adrenals/Urinary Tract: No adrenal mass. 6 mm stone in the lower pole of the left kidney. No ureteral stones or hydronephrosis. Urinary bladder unremarkable. Stomach/Bowel: Normal appendix. Stomach, large and small bowel grossly unremarkable. Vascular/Lymphatic: No evidence of aneurysm or adenopathy. Reproductive: Mildly prominent prostate Other: No free fluid or free air. Musculoskeletal: No acute bony abnormality. IMPRESSION: Left lower pole nephrolithiasis. No ureteral stones or hydronephrosis. No acute findings. Prostate enlargement. Electronically Signed   By: Charlett Nose M.D.   On: 11/13/2019 21:06    Procedures Procedures (including critical care time)  Medications Ordered in ED Medications  ondansetron [facility]) injection 4 mg (4 mg Intravenous Given 11/13/19 2045)  morphine 4 MG/ML injection 4 mg (4 mg Intravenous Given 11/13/19 2046)  sodium chloride 0.9 % bolus 500 mL (0 mLs Intravenous Stopped 11/13/19 2157)    ED Course  I have reviewed the triage vital signs and the nursing notes.  Pertinent labs & imaging results that were available during my care of the patient were reviewed by me and considered in my medical decision making (see chart for details).    MDM Rules/Calculators/A&P                      Pt is feeling much better.  He has no evidence of urinary infection or ureteral stone.  Due to pain radiating down his leg, I think it is likely from his back.  His bs is elevated, so I did  not want to start him on steroids.  He will go home with a short course of lortab.  Pt also put on miralax as he is already a little constipated.  Pt knows to return if worse.  F/u with pcp. Final Clinical Impression(s) / ED Diagnoses Final diagnoses:  Sciatica of left side  Hyperglycemia  Essential hypertension    Rx / DC Orders ED Discharge Orders         Ordered    HYDROcodone-acetaminophen (NORCO/VICODIN) 5-325 MG tablet  Every 4 hours PRN     11/13/19 2247    polyethylene glycol (MIRALAX) 17 g packet  Daily     11/13/19 2247           Jacalyn Lefevre, MD 11/13/19 2250

## 2019-11-13 NOTE — ED Notes (Signed)
Pt discharged to home. Discharge instructions have been discussed with patient and/or family members. Pt verbally acknowledges understanding d/c instructions, and states understanding to checkout at registration before leaving. 

## 2019-11-13 NOTE — ED Notes (Signed)
Patient denies pain and is resting comfortably.  

## 2019-11-13 NOTE — ED Notes (Signed)
Urinal provided to patient to collect urine specimen.

## 2019-11-16 ENCOUNTER — Ambulatory Visit: Payer: Medicare HMO | Admitting: Adult Health

## 2019-11-16 ENCOUNTER — Other Ambulatory Visit: Payer: Self-pay

## 2019-11-16 ENCOUNTER — Encounter: Payer: Self-pay | Admitting: Adult Health

## 2019-11-16 VITALS — BP 117/71 | HR 96 | Temp 97.0°F | Ht 67.0 in | Wt 171.0 lb

## 2019-11-16 DIAGNOSIS — I10 Essential (primary) hypertension: Secondary | ICD-10-CM | POA: Diagnosis not present

## 2019-11-16 DIAGNOSIS — G459 Transient cerebral ischemic attack, unspecified: Secondary | ICD-10-CM | POA: Diagnosis not present

## 2019-11-16 DIAGNOSIS — E1165 Type 2 diabetes mellitus with hyperglycemia: Secondary | ICD-10-CM

## 2019-11-16 DIAGNOSIS — E782 Mixed hyperlipidemia: Secondary | ICD-10-CM

## 2019-11-16 DIAGNOSIS — R42 Dizziness and giddiness: Secondary | ICD-10-CM

## 2019-11-16 NOTE — Progress Notes (Signed)
[facility] [address] [place]. [place] [address] [phone]       HOSPITAL FOLLOW UP NOTE  Mr. [name] Date of Birth:  02/11/1941 Medical Record Number:  [id number]   Reason for Referral:  hospital stroke follow up    CHIEF COMPLAINT:  Chief Complaint  Patient presents with  . 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  History obtained from patient, wife and chart review. Reviewed all radiology images and labs personally.  [name] a 79 y.o.malewith history of DMpresented on 09/27/2019 with diffuse L>R weakness and vertigo.  Evaluated by stroke team and Dr. Leonie Man with stroke work-up revealing posterior circulation TIA received IV tPA 09/27/2019 at 1005, likely due to small vessel disease in setting of uncontrolled glucose but cannot rule out embolic source given age.  CTA head/neck, MRI and 2D echo unremarkable.  Recommended initiating clopidogrel 75 mg daily as patient reported aspirin allergy/intolerance.  Recommended 30-day cardiac event monitor outpatient to rule out atrial fibrillation.  History of HTN stable during admission and resumed amlodipine 5 mg daily.  LDL 138 initiate atorvastatin 40 mg daily.  Uncontrolled DM with A1c 9.7 not currently on diabetic regimen initiating Metformin at discharge and advised close PCP follow-up.  Other stroke risk factors include advanced age but no prior history of stroke or TIA.  Discharged home in stable condition without therapy needs.  [name] is a 79 year old male who is being seen today for hospital follow-up accompanied by his wife.  He has been stable from a stroke standpoint without new or reoccurring stroke/TIA symptoms.  Continues on  clopidogrel and atorvastatin 40 mg daily for secondary stroke prevention without side effects.  Blood pressure today 117/71.  He does not routinely monitor glucose levels at home but does report ongoing use of Metformin.  Continues to follow with PCP for HTN, HLD and DM management.  30-day cardiac event monitor did not show evidence of atrial fibrillation or any abnormal arrhythmia.  His greatest concern today is regarding recent onset of lower left back and abdominal pain with left leg radiculopathy and increased chronic dizziness episodes.  Was evaluated in the ED and felt back pain likely related to sciatica and does have follow-up tomorrow with PCP for further evaluation.  Wife does note that he has history of kidney stone and was questioning whether this could be the cause.  Underlying chronic dizziness and has been experiencing increased dizziness episodes approximately 1-2 times weekly lasting 15 to 20 minutes.  Denies vertigo type symptoms.  He does report occasional headaches and unable to determine if headaches are present during dizziness/lightheadedness symptoms.  No further concerns at this time.     ROS:   14 system review of systems performed and negative with exception of dizziness, headache, back pain  PMH:  Past Medical History:  Diagnosis Date  . Diabetes mellitus without complication ([place])   . Hypertension   . TIA (transient ischemic attack)     PSH: No past surgical history on file.  Social History:  Social History   Socioeconomic History  . Marital status: Married    Spouse name: Not on file  . Number of children: Not on file  .  Years of education: Not on file  . Highest education level: Not on file  Occupational History  . Not on file  Tobacco Use  . Smoking status: Former Games developer  . Smokeless tobacco: Never Used  . Tobacco comment: pt quit 53 yrs ago  Substance and Sexual Activity  . Alcohol use: Never  . Drug use: Never  . Sexual activity: Not on file    Other Topics Concern  . Not on file  Social History Narrative  . Not on file   Social Determinants of Health   Financial Resource Strain:   . Difficulty of Paying Living Expenses:   Food Insecurity:   . Worried About Programme researcher, broadcasting/film/video in the Last Year:   . Barista in the Last Year:   Transportation Needs:   . Freight forwarder (Medical):   Marland Kitchen Lack of Transportation (Non-Medical):   Physical Activity:   . Days of Exercise per Week:   . Minutes of Exercise per Session:   Stress:   . Feeling of Stress :   Social Connections:   . Frequency of Communication with Friends and Family:   . Frequency of Social Gatherings with Friends and Family:   . Attends Religious Services:   . Active Member of Clubs or Organizations:   . Attends Banker Meetings:   Marland Kitchen Marital Status:   Intimate Partner Violence:   . Fear of Current or Ex-Partner:   . Emotionally Abused:   Marland Kitchen Physically Abused:   . Sexually Abused:     Family History: No family history on file.  Medications:   Current Outpatient Medications on File Prior to Visit  Medication Sig Dispense Refill  . amLODipine (NORVASC) 5 MG tablet Take 5 mg by mouth daily.    Marland Kitchen atorvastatin (LIPITOR) 40 MG tablet Take 1 tablet (40 mg total) by mouth daily at 6 PM. 30 tablet 2  . clopidogrel (PLAVIX) 75 MG tablet Take 1 tablet (75 mg total) by mouth daily. 30 tablet 2  . ergocalciferol (VITAMIN D2) 1.25 MG (50000 UT) capsule Take by mouth.    Marland Kitchen HYDROcodone-acetaminophen (NORCO/VICODIN) 5-325 MG tablet Take 1 tablet by mouth every 4 (four) hours as needed. 10 tablet 0  . MAGNESIUM-OXIDE 400 (241.3 Mg) MG tablet Take 1 tablet by mouth daily.    . metFORMIN (GLUCOPHAGE) 850 MG tablet Take 1 tablet (850 mg total) by mouth daily with breakfast. 30 tablet 2  . polyethylene glycol (MIRALAX) 17 g packet Take 17 g by mouth daily. 14 each 0  . Propylene Glycol (SYSTANE BALANCE) 0.6 % SOLN Place 1 drop into both eyes daily as  needed (dry eyes).    . tamsulosin (FLOMAX) 0.4 MG CAPS capsule Take 0.4 mg by mouth.     No current facility-administered medications on file prior to visit.    Allergies:   Allergies  Allergen Reactions  . Aspirin     nausea  . Atorvastatin     GI upset/stomach discomfort  . Lantus [Insulin Glargine] Nausea Only  . Metformin     GI upset/stomach discomfort     Physical Exam  Vitals:   11/16/19 1008  BP: 117/71  Pulse: 96  Temp: (!) 97 F (36.1 C)  Weight: 171 lb (77.6 kg)  Height: 5\' 7"  (1.702 m)   Body mass index is 26.78 kg/m. No exam data present  Depression screen [facility] 2/9 11/16/2019  Decreased Interest 0  Down, Depressed, Hopeless 0  PHQ - 2  Score 0     General: well developed, well nourished,  pleasant elderly African-American male, seated, in no evident distress Head: head normocephalic and atraumatic.   Neck: supple with no carotid or supraclavicular bruits Cardiovascular: regular rate and rhythm, no murmurs Musculoskeletal: no deformity Skin:  no rash/petichiae Vascular:  Normal pulses all extremities   Neurologic Exam Mental Status: Awake and fully alert.   Normal speech and language.  Oriented to place and time. Recent and remote memory intact. Attention span, concentration and fund of knowledge appropriate. Mood and affect appropriate.  Cranial Nerves: Fundoscopic exam reveals sharp disc margins. Pupils equal, briskly reactive to light. Extraocular movements full without nystagmus. Visual fields full to confrontation. Hearing intact. Facial sensation intact. Face, tongue, palate moves normally and symmetrically.  Motor: Normal bulk and tone. Normal strength in all tested extremity muscles. Sensory.: intact to touch , pinprick , position and vibratory sensation.  Coordination: Rapid alternating movements normal in all extremities. Finger-to-nose and heel-to-shin performed accurately bilaterally. Gait and Station: Arises from chair without difficulty.  Stance is normal. Gait demonstrates  favoring of left leg with use of cane due to lower back pain Reflexes: 1+ and symmetric. Toes downgoing.     NIHSS  0 Modified Rankin  0    Diagnostic Data (Labs, Imaging, Testing)   Code Stroke CT head No acute abnormality. Small vessel disease. ASPECTS 10.   CTA head & neck no LVO  CT perfusion small at risk area lateral R cerebellum  MRI no acute abnormality. Small vessel disease.   2D EchoEF 55-60%. No source of embolus     ASSESSMENT: [name] is a 79 y.o. year old male presented with diffuse L>R weakness and vertigo on 09/27/2019 likely posterior circulation TIA s/p TPA secondary to small vessel disease in setting of uncontrolled glucose but cannot rule out embolic source due to age. Vascular risk factors include HTN, HLD, uncontrolled DM and age.  30-day cardiac event monitor unremarkable.  Has been stable from a stroke standpoint.  Greatest concern today is regarding increased chronic dizziness episodes and left lower back pain.    PLAN:  1. TIA : Continue clopidogrel 75 mg daily  and atorvastatin for secondary stroke prevention.  Patient unable to be on aspirin due to reported aspirin allergy/intolerance.  Maintain strict control of hypertension with blood pressure goal below 130/90, diabetes with hemoglobin A1c goal below 6.5% and cholesterol with LDL cholesterol (bad cholesterol) goal below 70 mg/dL.  I also advised the patient to eat a healthy diet with plenty of whole grains, cereals, fruits and vegetables, exercise regularly with at least 30 minutes of continuous activity daily and maintain ideal body weight. 2. HTN: Advised to continue current treatment regimen.  Today's BP 117/71.  Advised to continue to monitor at home along with continued follow-up with PCP for management 3. HLD: Advised to continue current treatment regimen along with continued follow-up with PCP for future prescribing and monitoring of lipid  panel 4. DMII: Continuation of Metformin and recommend discussing with PCP further for possible need of monitoring at home to rule out hypoglycemia causing occasional dizziness episodes 5. Dizziness/lightheadedness: Unknown etiology.  Low suspicion for stroke related.  Questionable etiology complicated migraines vs blood pressure related vs hypoglycemia vs anxiety/depression vs medication side effect.  Advised ongoing evaluation by PCP.  Recommend obtaining a symptom diary with episode onset 6. Lower back pain: Defer further evaluation and management by PCP.    Follow up in 4 months or call earlier  if needed   Greater than 50% of time during this 45 minute visit was spent on counseling, explanation of diagnosis of TIA, reviewing risk factor management of HTN, HLD and uncontrolled DM, planning of further management along with potential future management, discussion regarding nonspecific dizziness, lower back pain and discussion with patient and family answering all questions.    Ihor Austin, AGNP-BC  [facility] [address] [place], [place] [address]  Phone [phone] Fax [phone] Note: This document was prepared with digital dictation and possible smart phrase technology. Any transcriptional errors that result from this process are unintentional.

## 2019-11-16 NOTE — Patient Instructions (Signed)
Continue clopidogrel 75 mg daily  and atovatatin  for secondary stroke prevention  Continue to follow up with PCP regarding cholesterol, blood pressure and diabetes management   Continue to monitor blood pressure at home  Maintain strict control of hypertension with blood pressure goal below 130/90, diabetes with hemoglobin A1c goal below 6.5% and cholesterol with LDL cholesterol (bad cholesterol) goal below 70 mg/dL. I also advised the patient to eat a healthy diet with plenty of whole grains, cereals, fruits and vegetables, exercise regularly and maintain ideal body weight.  Followup in the future with me in 4 months or call earlier if needed       Thank you for coming to see [place] at [facility]. I hope we have been able to provide you high quality care today.  You may receive a patient satisfaction survey over the next few weeks. We would appreciate your feedback and comments so that we may continue to improve ourselves and the health of our patients.

## 2019-11-23 ENCOUNTER — Ambulatory Visit: Payer: Medicare HMO | Attending: Internal Medicine

## 2019-11-23 DIAGNOSIS — Z23 Encounter for immunization: Secondary | ICD-10-CM

## 2019-11-23 NOTE — Progress Notes (Signed)
   Covid-19 Vaccination Clinic  Name:  KEISHON CHAVARIN    MRN: [id number] DOB: July 25, 1941  11/23/2019  Mr. Whistler was observed post Covid-19 immunization for 15 minutes without incident. He was provided with Vaccine Information Sheet and instruction to access the V-Safe system.   Mr. Mendonca was instructed to call 911 with any severe reactions post vaccine: Marland Kitchen Difficulty breathing  . Swelling of face and throat  . A fast heartbeat  . A bad rash all over body  . Dizziness and weakness   Immunizations Administered    Name Date Dose VIS Date Route   Pfizer COVID-19 Vaccine 11/23/2019 11:09 AM 0.3 mL 08/06/2019 Intramuscular   Manufacturer: [employer], [employer]   Lot: [id number]   NDC: [address]-1

## 2019-11-26 NOTE — Progress Notes (Signed)
I agree with the above plan 

## 2019-11-30 NOTE — Progress Notes (Signed)
Kindly inform the patient that cardiac event monitor study did not reveal evidence of atrial fibrillation.  There were few premature ventricular beats noted but nothing to worry about

## 2019-12-01 ENCOUNTER — Telehealth: Payer: Self-pay | Admitting: *Deleted

## 2019-12-01 NOTE — Telephone Encounter (Signed)
Spoke with patient and informed him that cardiac event monitor study did not reveal evidence of atrial fibrillation. There were few premature ventricular beats noted but nothing to worry about per Dr Pearlean Brownie. Patient verbalized understanding, appreciation.

## 2020-01-06 ENCOUNTER — Other Ambulatory Visit: Payer: Self-pay

## 2020-01-06 NOTE — Patient Outreach (Signed)
First telephone outreach attempt to obtain mRS. No answer. Left message for returned call.  Jaivyn Gulla [facility]-Care Management Assistant [phone] 

## 2020-01-07 ENCOUNTER — Other Ambulatory Visit: Payer: Self-pay

## 2020-01-07 NOTE — Patient Outreach (Signed)
Second telephone outreach attempt to obtain mRS. No answer. Left message for returned call.  Joyelle Siedlecki [facility]-Care Management Assistant [phone] 

## 2020-01-11 ENCOUNTER — Other Ambulatory Visit: Payer: Self-pay

## 2020-01-11 NOTE — Patient Outreach (Addendum)
Chronic stroke partnership list as of 01/11/20 states patient had No Stroke.  CMA will make updates on separate spreadsheet for documentation purposes.   Baruch Gouty Care Management Assistant [phone]

## 2020-03-20 ENCOUNTER — Ambulatory Visit: Payer: Medicare HMO | Admitting: Adult Health

## 2020-04-28 IMAGING — MR MR HEAD W/O CM
12 of 13 series · 43 of 48 positions shown · non-contrast
Comparison: MRI of the brain January 17, 2008

CLINICAL DATA: Neuro deficit, acute, stroke suspected. This

EXAM:
MRI HEAD WITHOUT CONTRAST
TECHNIQUE: Multiplanar, multiecho pulse sequences of the brain and surrounding
structures were obtained without intravenous contrast.

[Series 5: DWI · axial · 3.0mm · 0.88mm/px · z∈[-43,+78]mm · 6 of 84 slices shown (1 of 4)]
[im 1/84]
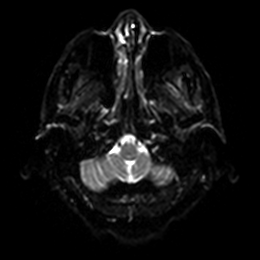
[im 17/84]
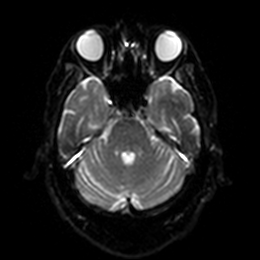
[im 34/84]
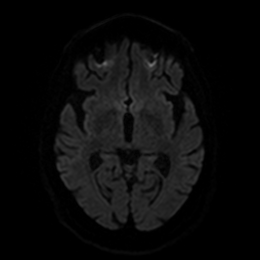
[im 50/84]
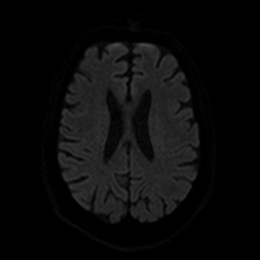
[im 67/84]
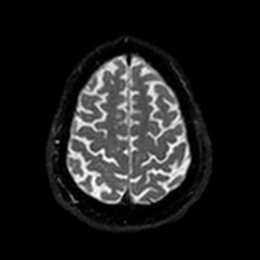
[im 84/84]
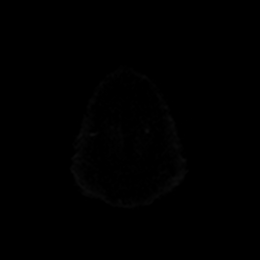

[Series 6: DWI · axial · 3.0mm · 0.88mm/px · z∈[-43,+78]mm · 3 of 42 slices shown (2 of 4)]
[im 1/42]
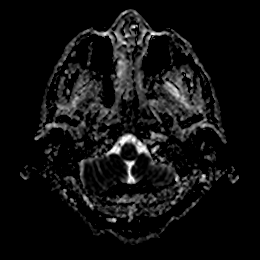
[im 21/42]
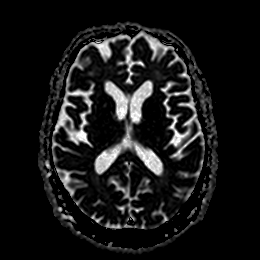
[im 42/42]
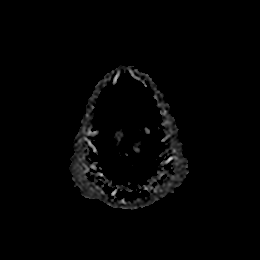

[Series 7: DWI · coronal · 4.0mm · 0.88mm/px · 5 of 64 slices shown (3 of 4)]
[im 1/64]
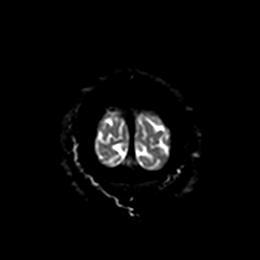
[im 16/64]
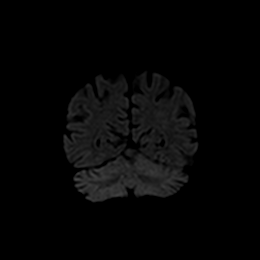
[im 32/64]
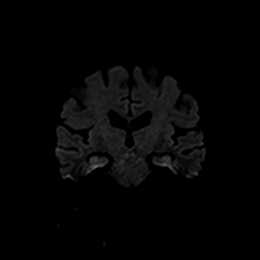
[im 48/64]
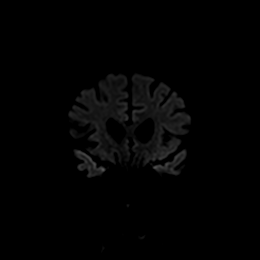
[im 64/64]
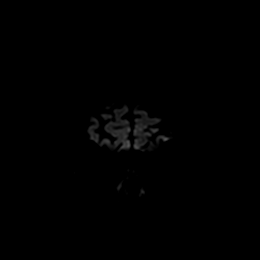

[Series 8: DWI · coronal · 4.0mm · 0.88mm/px · 3 of 32 slices shown (4 of 4)]
[im 1/32]
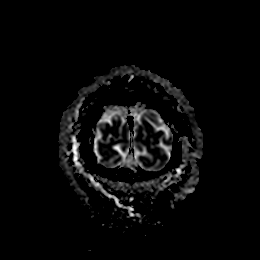
[im 16/32]
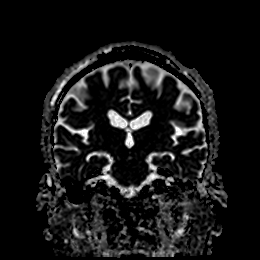
[im 32/32]
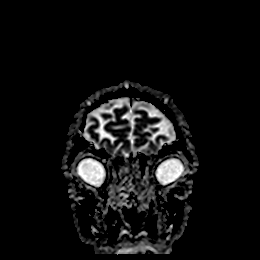

[Series 9: T1 · sagittal · 5.0mm · 0.75mm/px · 2 of 23 slices shown]
[im 1/23]
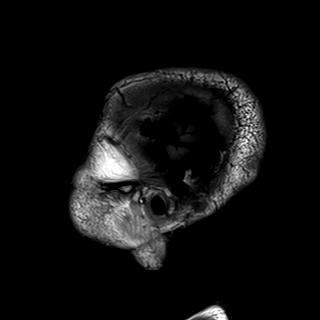
[im 23/23]
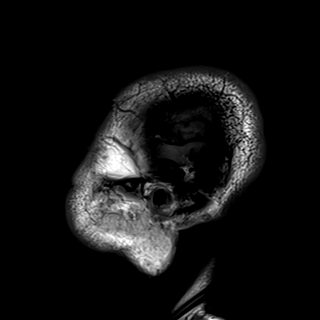

[Series 10: T2 · axial · 5.0mm · 0.72mm/px · z∈[-59,+83]mm · 2 of 25 slices shown (1 of 2)]
[im 1/25]
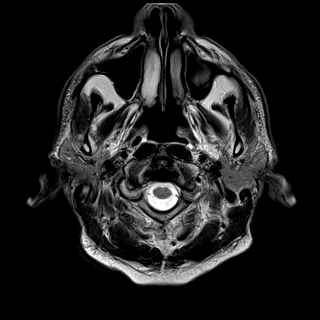
[im 25/25]
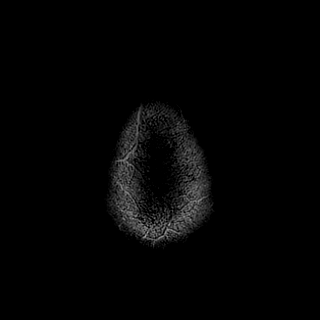

[Series 11: FLAIR · axial · 5.0mm · 0.45mm/px · z∈[-59,+83]mm · 2 of 25 slices shown]
[im 1/25]
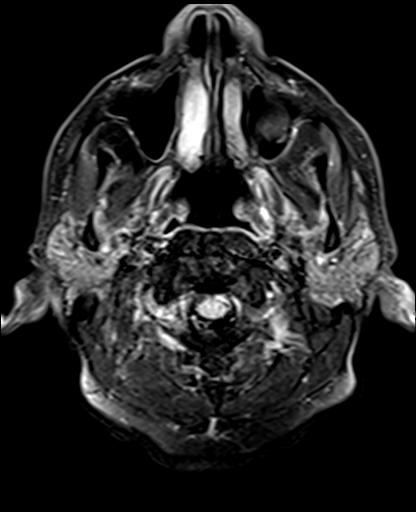
[im 25/25]
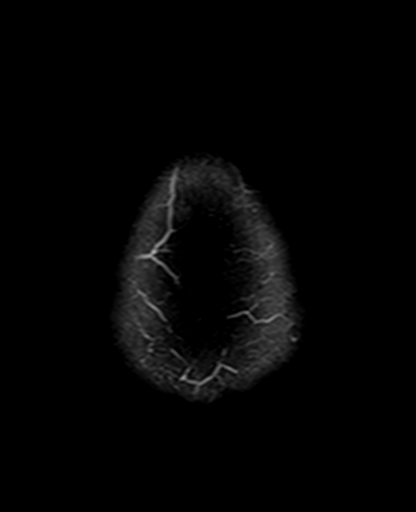

[Series 12: mag_images · axial · 3.0mm · 0.90mm/px · z∈[-70,+105]mm · 5 of 60 slices shown]
[im 1/60]
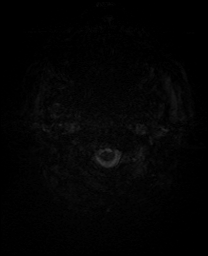
[im 15/60]
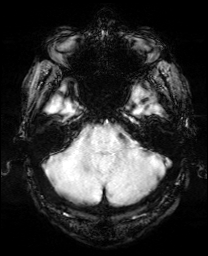
[im 30/60]
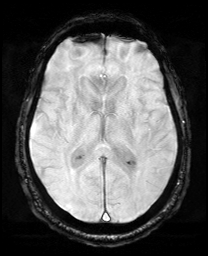
[im 45/60]
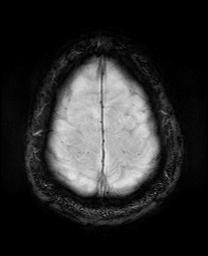
[im 60/60]
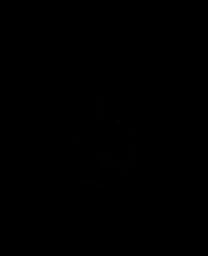

[Series 13: pha_images · axial · 3.0mm · 0.90mm/px · z∈[-70,+96]mm · 4 of 56 slices shown]
[im 1/56]
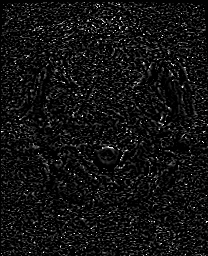
[im 19/56]
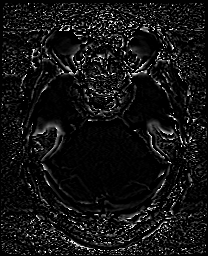
[im 37/56]
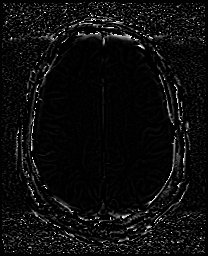
[im 56/56]
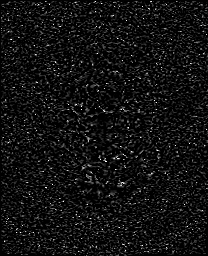

[Series 14: swi_images · axial · 3.0mm · 0.90mm/px · z∈[-70,+105]mm · 5 of 60 slices shown]
[im 1/60]
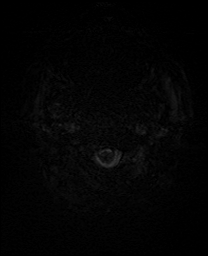
[im 15/60]
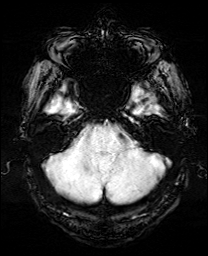
[im 30/60]
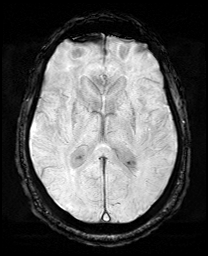
[im 45/60]
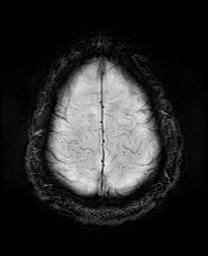
[im 60/60]
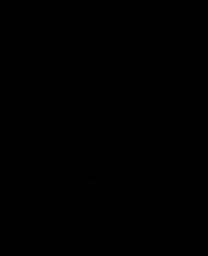

[Series 15: mip_images(sw) · axial · 24.0mm · 0.90mm/px · z∈[-60,+94]mm · 4 of 53 slices shown]
[im 1/53]
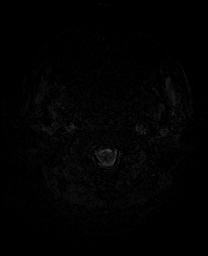
[im 18/53]
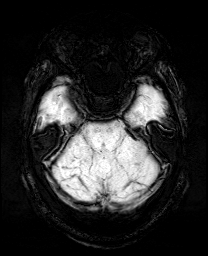
[im 35/53]
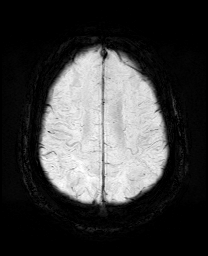
[im 53/53]
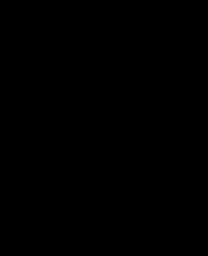

[Series 17: T2 · coronal · 5.0mm · 0.34mm/px · 2 of 29 slices shown (2 of 2)]
[im 1/29]
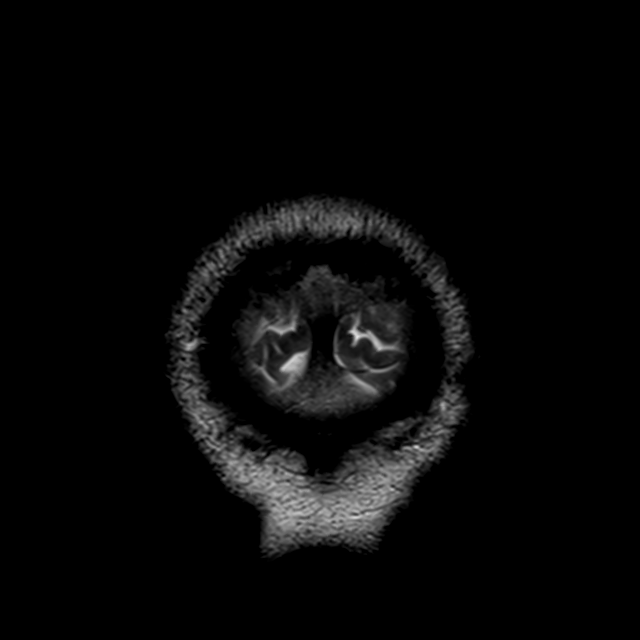
[im 29/29]
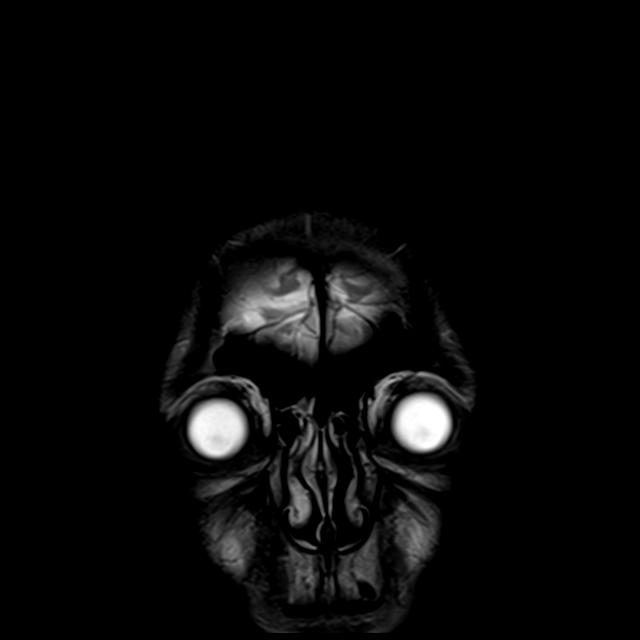

[43 of 48 positions shown; findings below may reference images not displayed]

FINDINGS: Brain: No acute infarction, hemorrhage, hydrocephalus, extra-axial
collection or mass lesion. Small scattered foci of T2 hyperintensity
are seen the white matter of the cerebral hemispheres, suggestive of
small vessel disease, mildly progressed from prior.

Vascular: Normal flow voids.

Skull and upper cervical spine: Normal marrow signal.

Sinuses/Orbits: Bilateral lens surgery is noted. Mild mucosal
thickening is seen the left maxillary antrum.
IMPRESSION: 1. No acute intracranial abnormality.
2. Mild chronic microvascular ischemic changes, mildly progressed
from prior study.

## 2022-05-30 ENCOUNTER — Encounter (HOSPITAL_COMMUNITY): Payer: Self-pay

## 2022-05-30 ENCOUNTER — Other Ambulatory Visit: Payer: Self-pay

## 2022-05-30 ENCOUNTER — Emergency Department (HOSPITAL_COMMUNITY): Payer: Medicare Other

## 2022-05-30 ENCOUNTER — Emergency Department (HOSPITAL_COMMUNITY)
Admission: EM | Admit: 2022-05-30 | Discharge: 2022-05-30 | Disposition: A | Discharge status: Home / Self Care | Payer: Medicare Other | Attending: Emergency Medicine | Admitting: Emergency Medicine

## 2022-05-30 DIAGNOSIS — R55 Syncope and collapse: Secondary | ICD-10-CM | POA: Insufficient documentation

## 2022-05-30 DIAGNOSIS — I10 Essential (primary) hypertension: Secondary | ICD-10-CM | POA: Insufficient documentation

## 2022-05-30 DIAGNOSIS — E119 Type 2 diabetes mellitus without complications: Secondary | ICD-10-CM | POA: Insufficient documentation

## 2022-05-30 DIAGNOSIS — Z7901 Long term (current) use of anticoagulants: Secondary | ICD-10-CM | POA: Diagnosis not present

## 2022-05-30 DIAGNOSIS — E860 Dehydration: Secondary | ICD-10-CM

## 2022-05-30 DIAGNOSIS — R739 Hyperglycemia, unspecified: Secondary | ICD-10-CM

## 2022-05-30 DIAGNOSIS — Z79899 Other long term (current) drug therapy: Secondary | ICD-10-CM | POA: Insufficient documentation

## 2022-05-30 LAB — URINALYSIS, ROUTINE W REFLEX MICROSCOPIC
Bacteria, UA: NONE SEEN
Bilirubin Urine: NEGATIVE
Glucose, UA: >=500 mg/dL — AB
Hgb urine dipstick: NEGATIVE
Ketones, ur: NEGATIVE mg/dL
Leukocytes,Ua: NEGATIVE
Nitrite: NEGATIVE
Protein, ur: NEGATIVE mg/dL
Specific Gravity, Urine: 1.016 (ref 1.005–1.030)
pH: 6 (ref 5.0–8.0)

## 2022-05-30 LAB — CBC WITH DIFFERENTIAL/PLATELET
Abs Immature Granulocytes: 0.07 10*3/uL (ref 0.00–0.07)
Basophils Absolute: 0 10*3/uL (ref 0.0–0.1)
Basophils Relative: 0 %
Eosinophils Absolute: 0 10*3/uL (ref 0.0–0.5)
Eosinophils Relative: 0 %
HCT: 35.6 % — ABNORMAL LOW (ref 39.0–52.0)
Hemoglobin: 11.6 g/dL — ABNORMAL LOW (ref 13.0–17.0)
Immature Granulocytes: 1 %
Lymphocytes Relative: 5 %
Lymphs Abs: 0.5 10*3/uL — ABNORMAL LOW (ref 0.7–4.0)
MCH: 29.1 pg (ref 26.0–34.0)
MCHC: 32.6 g/dL (ref 30.0–36.0)
MCV: 89.2 fL (ref 80.0–100.0)
Monocytes Absolute: 0.6 10*3/uL (ref 0.1–1.0)
Monocytes Relative: 6 %
Neutro Abs: 8.5 10*3/uL — ABNORMAL HIGH (ref 1.7–7.7)
Neutrophils Relative %: 88 %
Platelets: 150 10*3/uL (ref 150–400)
RBC: 3.99 MIL/uL — ABNORMAL LOW (ref 4.22–5.81)
RDW: 11.9 % (ref 11.5–15.5)
WBC: 9.7 10*3/uL (ref 4.0–10.5)
nRBC: 0 % (ref 0.0–0.2)

## 2022-05-30 LAB — BASIC METABOLIC PANEL
Anion gap: 12 (ref 5–15)
BUN: 12 mg/dL (ref 8–23)
CO2: 21 mmol/L — ABNORMAL LOW (ref 22–32)
Calcium: 8.8 mg/dL — ABNORMAL LOW (ref 8.9–10.3)
Chloride: 99 mmol/L (ref 98–111)
Creatinine, Ser: 1.38 mg/dL — ABNORMAL HIGH (ref 0.61–1.24)
GFR, Estimated: 52 mL/min — ABNORMAL LOW (ref >60)
Glucose, Bld: 273 mg/dL — ABNORMAL HIGH (ref 70–99)
Potassium: 4.5 mmol/L (ref 3.5–5.1)
Sodium: 132 mmol/L — ABNORMAL LOW (ref 135–145)

## 2022-05-30 LAB — CBG MONITORING, ED: Glucose-Capillary: 297 mg/dL — ABNORMAL HIGH (ref 70–99)

## 2022-05-30 MED ORDER — SODIUM CHLORIDE 0.9 % IV SOLN: INTRAVENOUS | Status: DC

## 2022-05-30 MED ORDER — SODIUM CHLORIDE 0.9 % IV BOLUS
1000.0000 mL | Freq: Once | INTRAVENOUS | Status: AC
  Administered 2022-05-30: 1000 mL via INTRAVENOUS

## 2022-05-30 NOTE — ED Notes (Signed)
Patient transported to CT 

## 2022-05-30 NOTE — Discharge Instructions (Signed)
You have been evaluated for your symptoms.  Your symptoms could be due to a seizure episode.  Therefore, please call and follow-up closely with your neurologist for outpatient evaluation which may include an EEG.  The blood sugar is elevated today, make sure to take your medication appropriately.  You are dehydrated, stay hydrated and recheck your labs with your doctor.  Return to the ER if your symptoms worsen or if you have any other concern.

## 2022-05-30 NOTE — ED Triage Notes (Signed)
Pt BIB [facility] from home after LOC, family found patient on the toilet incontinent of bowel and urine (not baseline). Family reported to EMS that he was out for ~25 mins, initial pressure was 80/50 w/ EMS and only responsive to pain. BP 150/80 at this time, NSR. EMS administered 559mL NS en route.

## 2022-05-30 NOTE — ED Notes (Signed)
Patient provided sandwich and hot tea, per family request. Patient sitting in the bed eating at this time.

## 2022-05-30 NOTE — ED Provider Notes (Signed)
[facility] EMERGENCY DEPARTMENT Provider Note   CSN: [id number] Arrival date & time: 05/30/22  1510     History  Chief Complaint  Patient presents with   Loss of Consciousness    [name] is a 81 y.o. male.  The history is provided by the patient, the EMS personnel, the spouse and medical records. No language interpreter was used.  Loss of Consciousness    [name] is an 81yo male with PMHx of T2DM, HTN, migraine and prior TIA who present to ED following a syncopal episode. He is accompanied by wife and grandson who assist with history. Wife states that pt went to the bathroom earlier this afternoon. She noticed that he was taking a long time and when she went to check on him, she found him unresponsive. She called her grandson to assist then alerted EMS at 1:56pm. Lucila Maine states when he found the pt, he was sitting on the toilet, but the lid was closed. He was not alert, mouth was open and he was making "twitching" movements with his hands. Grandson also states pt stopped breathing for about 10 seconds. He assisted the pt to the ground per 911 dispatcher instruction. When EMS arrived, pt had lost bowel/bladder contents. Pt does not recall any of these events. He recalls waking to EMS personnel assisting him. He states that he has had a HA for about a week. It comes and goes, and has tried Tylenol with minimal relief. It was located over his right eye, but he is not currently in pain. Pt denies fever, chill, NS, chest pain, visual changes, N/V.  Home Medications Prior to Admission medications   Medication Sig Start Date End Date Taking? Authorizing Provider  amLODipine (NORVASC) 5 MG tablet Take 5 mg by mouth daily. 04/01/19   [provider]  atorvastatin (LIPITOR) 40 MG tablet Take 1 tablet (40 mg total) by mouth daily at 6 PM. 09/28/19   Layne Benton, NP  clopidogrel (PLAVIX) 75 MG tablet Take 1 tablet (75 mg total) by mouth daily. 09/28/19   Layne Benton, NP  ergocalciferol (VITAMIN D2) 1.25 MG (50000 UT) capsule Take by mouth. 10/28/19   [provider]  HYDROcodone-acetaminophen (NORCO/VICODIN) 5-325 MG tablet Take 1 tablet by mouth every 4 (four) hours as needed. 11/13/19   Jacalyn Lefevre, MD  MAGNESIUM-OXIDE 400 (241.3 Mg) MG tablet Take 1 tablet by mouth daily. 10/28/19   [provider]  metFORMIN (GLUCOPHAGE) 850 MG tablet Take 1 tablet (850 mg total) by mouth daily with breakfast. 09/29/19   Layne Benton, NP  polyethylene glycol (MIRALAX) 17 g packet Take 17 g by mouth daily. 11/13/19   Jacalyn Lefevre, MD  Propylene Glycol (SYSTANE BALANCE) 0.6 % SOLN Place 1 drop into both eyes daily as needed (dry eyes).    [provider]  tamsulosin (FLOMAX) 0.4 MG CAPS capsule Take 0.4 mg by mouth.    [provider]      Allergies    Aspirin, Atorvastatin, Lantus [insulin glargine], and Metformin    Review of Systems   Review of Systems  Cardiovascular:  Positive for syncope.  All other systems reviewed and are negative.   Physical Exam Updated Vital Signs BP (!) 174/80 (BP Location: Right Arm)   Pulse 70   Temp 97.8 F (36.6 C) (Oral)   Resp 13   SpO2 100%  Physical Exam Vitals and nursing note reviewed.  Constitutional:      General: He  is not in acute distress.    Appearance: He is well-developed.  HENT:     Head: Atraumatic.  Eyes:     Conjunctiva/sclera: Conjunctivae normal.  Cardiovascular:     Rate and Rhythm: Normal rate and regular rhythm.     Pulses: Normal pulses.     Heart sounds: Normal heart sounds.  Pulmonary:     Effort: Pulmonary effort is normal.     Breath sounds: Normal breath sounds.  Abdominal:     Palpations: Abdomen is soft.     Tenderness: There is no abdominal tenderness.  Musculoskeletal:     Cervical back: Neck supple.     Comments: 5 out of 5 strength to all 4 extremities.  Skin:    Findings: No rash.  Neurological:     Mental Status: He is alert  and oriented to person, place, and time.  Psychiatric:        Mood and Affect: Mood normal.     ED Results / Procedures / Treatments   Labs (all labs ordered are listed, but only abnormal results are displayed) Labs Reviewed  BASIC METABOLIC PANEL - Abnormal; Notable for the following components:      Result Value   Sodium 132 (*)    CO2 21 (*)    Glucose, Bld 273 (*)    Creatinine, Ser 1.38 (*)    Calcium 8.8 (*)    GFR, Estimated 52 (*)    All other components within normal limits  CBC WITH DIFFERENTIAL/PLATELET - Abnormal; Notable for the following components:   RBC 3.99 (*)    Hemoglobin 11.6 (*)    HCT 35.6 (*)    Neutro Abs 8.5 (*)    Lymphs Abs 0.5 (*)    All other components within normal limits  URINALYSIS, ROUTINE W REFLEX MICROSCOPIC - Abnormal; Notable for the following components:   Glucose, UA >=500 (*)    All other components within normal limits  CBG MONITORING, ED - Abnormal; Notable for the following components:   Glucose-Capillary 297 (*)    All other components within normal limits    EKG EKG Interpretation  Date/Time:  Thursday May 30 2022 15:30:03 EDT Ventricular Rate:  77 PR Interval:  171 QRS Duration: 92 QT Interval:  416 QTC Calculation: 471 R Axis:   61 Text Interpretation: Sinus rhythm Consider left ventricular hypertrophy Confirmed by Cathren Laine ([id number]) on 05/30/2022 5:09:49 PM  Radiology CT Head Wo Contrast  Result Date: 05/30/2022 CLINICAL DATA:  Headaches EXAM: CT HEAD WITHOUT CONTRAST TECHNIQUE: Contiguous axial images were obtained from the base of the skull through the vertex without intravenous contrast. RADIATION DOSE REDUCTION: This exam was performed according to the departmental dose-optimization program which includes automated exposure control, adjustment of the mA and/or kV according to patient size and/or use of iterative reconstruction technique. COMPARISON:  Previous CT done on 09/27/2019, MR brain done on 09/28/2019  FINDINGS: Brain: No acute intracranial findings are seen. There are no signs of bleeding within the cranium. Cortical sulci are prominent. Ventricles are unremarkable. Vascular: Unremarkable. Skull: No fracture is seen. Sinuses/Orbits: There is mucosal thickening in ethmoid sinus. Mucous retention cyst is seen in left maxillary sinus. Other: None. IMPRESSION: No acute intracranial findings are seen in noncontrast CT brain. Atrophy. Chronic sinusitis. Electronically Signed   By: Ernie Avena M.D.   On: 05/30/2022 17:20    Procedures Procedures    Medications Ordered in ED Medications  sodium chloride 0.9 % bolus 1,000 mL (0 mLs Intravenous  Stopped 05/30/22 1731)    And  0.9 %  sodium chloride infusion (has no administration in time range)    ED Course/ Medical Decision Making/ A&P                           Medical Decision Making Amount and/or Complexity of Data Reviewed Labs: ordered. Radiology: ordered.  Risk Prescription drug management.   BP (!) 174/80 (BP Location: Right Arm)   Pulse 70   Temp 97.8 F (36.6 C) (Oral)   Resp 13   SpO2 100%   29:94 PM  81 year old male significant history of hypertension, diabetes, TIA, brought here via EMS from home for evaluation of loss of consciousness.  History obtained through EMS and through family member who is at bedside.  Patient's wife report patient went to use the bathroom.  He was in the bathroom for longer than usual.  His son went to check up on him and he was sitting on the commode.  His unresponsive.  EMS was called and patient was guided down to the ground.  When EMS arrived, patient was noted to have initial blood pressure of 80/50.  At the time patient only responds to painful stimuli.  Patient was given 500 mL of normal saline on route and brought here.  He is now back to his baseline.  Patient unable to recall the event.  He is currently denies having any significant pain.  He endorsed a mild headache which is not  unusual for him.  Does not complain of any neck pain, chest pain, heart palpitation, abdominal pain, back pain, focal numbness or weakness.  Wife mentioned patient has had several similar near syncopal episode in the past but this episode with much longer lasting and felt to be longer than 10 minutes.  On exam, this is an elderly male laying in bed resting comfortably appears to be in no acute discomfort.  No signs of trauma.  Head and neck nontender to palpation.  Heart with normal rate and rhythm, lungs are clear to auscultation, abdomen soft nontender.  No midline spine tenderness.  He is able to move all 4 extremities without difficulty.  He follows command.  He is alert and oriented x2.  He has no focal neurodeficit.  He does have an implantable device in his chest which has been there for the past 2 years due to his history of near syncope but family member states they are unaware of any specific diagnosis coming from having the device.  He is currently not on Plavix.  Patient has a [employer] loop recorder.  It records in a 4-day interval.  I discussed finding with the [employer] technician who report no abnormal recording within the past 4 days and battery is normal functioning.  Labs, EKG, and imaging obtained independently viewed interpreted by me and I agree with radiologist interpretation.  Patient does have elevated blood sugar of 297 with normal anion gap.  Urine without signs of urine tract infection and no ketones.  No evidence to suggest DKA.  Mild AKI with creatinine of 1.38, IV fluid given.  Normal WBC, normal H&H.  CT scan of the head without any acute finding.  Patient currently resting comfortably.  He received IV fluid.  He ate and drink without difficulty.  Consider seizure as the cause of his symptoms.  He may benefit from outpatient follow-up with neurology for outpatient EEG.  At this time patient is at baseline.  He is able to ambulate.  He has a drink.  I discussed finding with  patient as well as family member.  Encouraged outpatient follow-up with neurology for further assessment.  Patient does have a neurologist in [facility], Dr. Marlou Sa.  I gave strict return precaution.  Also recommend close monitoring of his blood sugar as it is elevated today.  This patient presents to the ED for concern of syncope, this involves an extensive number of treatment options, and is a complaint that carries with it a high risk of complications and morbidity.  The differential diagnosis includes vasovagal syncope, stroke, seizure, cardiac arrhythmia, dehydration, anemia, electrolytes derangement  Co morbidities that complicate the patient evaluation TIA  DM  HTN Additional history obtained:  Additional history obtained from family  External records from outside source obtained and reviewed including EMR including labs and imaging  Lab Tests:  I Ordered, and personally interpreted labs.  The pertinent results include:  as above  Imaging Studies ordered:  I ordered imaging studies including head CT I independently visualized and interpreted imaging which showed no acute finding I agree with the radiologist interpretation  Cardiac Monitoring:  The patient was maintained on a cardiac monitor.  I personally viewed and interpreted the cardiac monitored which showed an underlying rhythm of: NSR  Medicines ordered and prescription drug management:  I ordered medication including IVF  for dehydration Reevaluation of the patient after these medicines showed that the patient improved I have reviewed the patients home medicines and have made adjustments as needed  Test Considered: EEG  Brain MRI  Critical Interventions: IVF    Consultations Obtained:  I requested consultation with the attending Dr. Ashok Cordia,  and discussed lab and imaging findings as well as pertinent plan - they recommend: oupt f/u  Problem List / ED Course: syncope   Reevaluation:  After the  interventions noted above, I reevaluated the patient and found that they have :improved  Social Determinants of Health: tobacco use  Dispostion:  After consideration of the diagnostic results and the patients response to treatment, I feel that the patent would benefit from oupt f/u.          Final Clinical Impression(s) / ED Diagnoses Final diagnoses:  None    Rx / DC Orders ED Discharge Orders     None         Domenic Moras, PA-C 05/30/22 1930    Lajean Saver, MD 05/31/22 1329

## 2022-06-01 ENCOUNTER — Emergency Department (HOSPITAL_COMMUNITY)
Admission: EM | Admit: 2022-06-01 | Discharge: 2022-06-01 | Disposition: A | Discharge status: Home / Self Care | Payer: Medicare Other | Attending: Emergency Medicine | Admitting: Emergency Medicine

## 2022-06-01 ENCOUNTER — Encounter (HOSPITAL_COMMUNITY): Payer: Self-pay | Admitting: Emergency Medicine

## 2022-06-01 ENCOUNTER — Emergency Department (HOSPITAL_COMMUNITY): Payer: Medicare Other

## 2022-06-01 ENCOUNTER — Other Ambulatory Visit: Payer: Self-pay

## 2022-06-01 DIAGNOSIS — R42 Dizziness and giddiness: Secondary | ICD-10-CM | POA: Diagnosis not present

## 2022-06-01 DIAGNOSIS — R519 Headache, unspecified: Secondary | ICD-10-CM | POA: Diagnosis present

## 2022-06-01 DIAGNOSIS — E119 Type 2 diabetes mellitus without complications: Secondary | ICD-10-CM | POA: Insufficient documentation

## 2022-06-01 DIAGNOSIS — Z79899 Other long term (current) drug therapy: Secondary | ICD-10-CM | POA: Insufficient documentation

## 2022-06-01 DIAGNOSIS — Z7984 Long term (current) use of oral hypoglycemic drugs: Secondary | ICD-10-CM | POA: Diagnosis not present

## 2022-06-01 DIAGNOSIS — I10 Essential (primary) hypertension: Secondary | ICD-10-CM | POA: Diagnosis not present

## 2022-06-01 LAB — COMPREHENSIVE METABOLIC PANEL
ALT: 19 U/L (ref 0–44)
AST: 30 U/L (ref 15–41)
Albumin: 3.7 g/dL (ref 3.5–5.0)
Alkaline Phosphatase: 59 U/L (ref 38–126)
Anion gap: 10 (ref 5–15)
BUN: 17 mg/dL (ref 8–23)
CO2: 23 mmol/L (ref 22–32)
Calcium: 9.1 mg/dL (ref 8.9–10.3)
Chloride: 102 mmol/L (ref 98–111)
Creatinine, Ser: 1.4 mg/dL — ABNORMAL HIGH (ref 0.61–1.24)
GFR, Estimated: 51 mL/min — ABNORMAL LOW (ref >60)
Glucose, Bld: 269 mg/dL — ABNORMAL HIGH (ref 70–99)
Potassium: 4.2 mmol/L (ref 3.5–5.1)
Sodium: 135 mmol/L (ref 135–145)
Total Bilirubin: 0.4 mg/dL (ref 0.3–1.2)
Total Protein: 7.1 g/dL (ref 6.5–8.1)

## 2022-06-01 LAB — URINALYSIS, ROUTINE W REFLEX MICROSCOPIC
Bacteria, UA: NONE SEEN
Bilirubin Urine: NEGATIVE
Glucose, UA: >=500 mg/dL — AB
Ketones, ur: NEGATIVE mg/dL
Leukocytes,Ua: NEGATIVE
Nitrite: NEGATIVE
Protein, ur: NEGATIVE mg/dL
Specific Gravity, Urine: 1.026 (ref 1.005–1.030)
pH: 5 (ref 5.0–8.0)

## 2022-06-01 LAB — CBC
HCT: 33.6 % — ABNORMAL LOW (ref 39.0–52.0)
Hemoglobin: 11.2 g/dL — ABNORMAL LOW (ref 13.0–17.0)
MCH: 29.7 pg (ref 26.0–34.0)
MCHC: 33.3 g/dL (ref 30.0–36.0)
MCV: 89.1 fL (ref 80.0–100.0)
Platelets: 127 10*3/uL — ABNORMAL LOW (ref 150–400)
RBC: 3.77 MIL/uL — ABNORMAL LOW (ref 4.22–5.81)
RDW: 11.9 % (ref 11.5–15.5)
WBC: 4.8 10*3/uL (ref 4.0–10.5)
nRBC: 0 % (ref 0.0–0.2)

## 2022-06-01 LAB — LACTIC ACID, PLASMA: Lactic Acid, Venous: 1.8 mmol/L (ref 0.5–1.9)

## 2022-06-01 MED ORDER — ACETAMINOPHEN 325 MG PO TABS
650.0000 mg | ORAL_TABLET | Freq: Once | ORAL | Status: AC
  Administered 2022-06-01: 650 mg via ORAL
  Filled 2022-06-01: qty 2

## 2022-06-01 MED ORDER — PROMETHAZINE HCL 12.5 MG PO TABS: 12.5000 mg | ORAL_TABLET | Freq: Three times a day (TID) | ORAL | 0 refills | Status: DC | PRN

## 2022-06-01 MED ORDER — PROMETHAZINE HCL 25 MG PO TABS
12.5000 mg | ORAL_TABLET | Freq: Once | ORAL | Status: AC
  Administered 2022-06-01: 12.5 mg via ORAL
  Filled 2022-06-01: qty 1

## 2022-06-01 NOTE — Discharge Instructions (Addendum)
Please follow up with your neurologist  It was a pleasure caring for you today in the emergency department.  Please return to the emergency department for any worsening or worrisome symptoms.  

## 2022-06-01 NOTE — ED Provider Notes (Signed)
[facility] EMERGENCY DEPARTMENT Provider Note   CSN: [id number] Arrival date & time: 06/01/22  1330     History  Chief Complaint  Patient presents with   Dizziness   Headache    [name] is a 81 y.o. male.  Patient as above with significant medical history as below, including DM, hypertension, chronic headaches who presents to the ED with complaint of headache.  Patient had low-grade fever prior to arrival, he is also having stable headache.  Described as a bandlike sensation across his forehead.  Nausea or vomiting.  No chills.  No numbness or tingling.  No falls.  No neck pain.  No vision or hearing changes.  No behavior changes.  He has been taking his typical medications at home without much improvement to his headache.  He follows with neurology at [facility].  Patient had CT 2 days ago following syncopal episode.  No further episodes of syncope since then.  No chest pain or dyspnea.  No dizziness reported by the patient as was noted in triage documentation  hhe describes the headache is mild, rated 3 out of 10.  Prior to arrival headache was rated close to an 8 out of 10.     Past Medical History:  Diagnosis Date   Diabetes mellitus without complication ([place])    Hypertension    TIA (transient ischemic attack)     History reviewed. No pertinent surgical history.   The history is provided by the patient. No language interpreter was used.  Dizziness Associated symptoms: headaches   Associated symptoms: no chest pain, no nausea, no palpitations, no shortness of breath and no vomiting   Headache Associated symptoms: fever   Associated symptoms: no abdominal pain, no cough, no dizziness, no nausea, no photophobia and no vomiting        Home Medications Prior to Admission medications   Medication Sig Start Date End Date Taking? Authorizing Provider  promethazine (PHENERGAN) 12.5 MG tablet Take 1 tablet (12.5 mg total) by mouth every 8 (eight)  hours as needed (headache). 06/01/22  Yes Jeanell Sparrow, DO  acetaminophen (TYLENOL) 325 MG tablet Take 325 mg by mouth every 4 (four) hours as needed for mild pain or moderate pain.    [provider]  Blood Glucose Monitoring Suppl (ACCU-CHEK GUIDE ME) w/Device KIT See admin instructions. 06/01/20   [provider]  celecoxib (CELEBREX) 200 MG capsule Take by mouth. Patient not taking: Reported on 05/30/2022 12/28/21   [provider]  diclofenac (CATAFLAM) 50 MG tablet Take 50 mg by mouth 2 (two) times daily as needed. 12/06/21   [provider]  divalproex (DEPAKOTE ER) 500 MG 24 hr tablet Take 500 mg by mouth daily. 12/06/21   [provider]  donepezil (ARICEPT) 10 MG tablet Take 10 mg by mouth at bedtime. 03/06/22   [provider]  Lancets Misc. (ACCU-CHEK FASTCLIX LANCET) KIT See admin instructions. 03/21/22   [provider]  MAGNESIUM-OXIDE 400 (241.3 Mg) MG tablet Take 1 tablet by mouth See admin instructions. Take one tablet 400 (241.3 Mg) MG by mouth daily. 10/28/19   [provider]  metFORMIN (GLUCOPHAGE) 1000 MG tablet Take 1,000 mg by mouth 2 (two) times daily. 02/01/22   [provider]  metFORMIN (GLUCOPHAGE) 850 MG tablet Take 1 tablet (850 mg total) by mouth daily with breakfast. Patient taking differently: Take 1,000 mg by mouth daily with breakfast. 09/29/19   Donzetta Starch, NP  nortriptyline (  PAMELOR) 10 MG capsule Take 10 mg by mouth 2 (two) times daily. 05/27/22   [provider]  tamsulosin (FLOMAX) 0.4 MG CAPS capsule Take 0.4 mg by mouth.    [provider]      Allergies    Aspirin, Atorvastatin, Lantus [insulin glargine], and Metformin    Review of Systems   Review of Systems  Constitutional:  Positive for fever. Negative for chills.  HENT:  Negative for facial swelling and trouble swallowing.   Eyes:  Negative for photophobia and visual disturbance.  Respiratory:  Negative  for cough and shortness of breath.   Cardiovascular:  Negative for chest pain and palpitations.  Gastrointestinal:  Negative for abdominal pain, nausea and vomiting.  Endocrine: Negative for polydipsia and polyuria.  Genitourinary:  Negative for difficulty urinating and hematuria.  Musculoskeletal:  Negative for gait problem and joint swelling.  Skin:  Negative for pallor and rash.  Neurological:  Positive for headaches. Negative for dizziness and syncope.  Psychiatric/Behavioral:  Negative for agitation and confusion.     Physical Exam Updated Vital Signs BP (!) 164/85   Pulse 95   Temp 98.9 F (37.2 C) (Oral)   Resp 16   SpO2 100%  Physical Exam Vitals and nursing note reviewed.  Constitutional:      General: He is not in acute distress.    Appearance: Normal appearance. He is well-developed. He is not ill-appearing, toxic-appearing or diaphoretic.  HENT:     Head: Normocephalic and atraumatic.     Right Ear: External ear normal.     Left Ear: External ear normal.     Mouth/Throat:     Mouth: Mucous membranes are moist.  Eyes:     General: No scleral icterus.    Extraocular Movements: Extraocular movements intact.     Pupils: Pupils are equal, round, and reactive to light.  Neck:     Vascular: No carotid bruit.     Meningeal: Brudzinski's sign and Kernig's sign absent.  Cardiovascular:     Rate and Rhythm: Normal rate and regular rhythm.     Pulses: Normal pulses.     Heart sounds: Normal heart sounds.  Pulmonary:     Effort: Pulmonary effort is normal. No respiratory distress.     Breath sounds: Normal breath sounds.  Abdominal:     General: Abdomen is flat.     Palpations: Abdomen is soft.     Tenderness: There is no abdominal tenderness.  Musculoskeletal:        General: Normal range of motion.     Cervical back: Full passive range of motion without pain and normal range of motion. No rigidity.     Right lower leg: No edema.     Left lower leg: No edema.   Skin:    General: Skin is warm and dry.     Capillary Refill: Capillary refill takes less than 2 seconds.  Neurological:     Mental Status: He is alert and oriented to person, place, and time. Mental status is at baseline.     GCS: GCS eye subscore is 4. GCS verbal subscore is 5. GCS motor subscore is 6.     Cranial Nerves: Cranial nerves 2-12 are intact. No facial asymmetry.     Sensory: Sensation is intact. No sensory deficit.     Motor: Motor function is intact. No tremor.     Coordination: Coordination is intact. Finger-Nose-Finger Test normal.     Gait: Gait is intact.  Comments: Strength 5/5 bilateral upper extremities Strength 5/5 bilateral lower extremities   Psychiatric:        Mood and Affect: Mood normal.        Behavior: Behavior normal.     ED Results / Procedures / Treatments   Labs (all labs ordered are listed, but only abnormal results are displayed) Labs Reviewed  CBC - Abnormal; Notable for the following components:      Result Value   RBC 3.77 (*)    Hemoglobin 11.2 (*)    HCT 33.6 (*)    Platelets 127 (*)    All other components within normal limits  COMPREHENSIVE METABOLIC PANEL - Abnormal; Notable for the following components:   Glucose, Bld 269 (*)    Creatinine, Ser 1.40 (*)    GFR, Estimated 51 (*)    All other components within normal limits  URINALYSIS, ROUTINE W REFLEX MICROSCOPIC - Abnormal; Notable for the following components:   Glucose, UA >=500 (*)    Hgb urine dipstick SMALL (*)    All other components within normal limits  CULTURE, BLOOD (SINGLE)  LACTIC ACID, PLASMA  LACTIC ACID, PLASMA    EKG EKG Interpretation  Date/Time:  Saturday June 01 2022 14:39:53 EDT Ventricular Rate:  91 PR Interval:  164 QRS Duration: 84 QT Interval:  350 QTC Calculation: 430 R Axis:   67 Text Interpretation: Normal sinus rhythm Normal ECG When compared with ECG of 30-May-2022 15:30, PREVIOUS ECG IS PRESENT no stemi Confirmed by Wynona Dove  ([id number]) on 06/01/2022 9:03:31 PM  Radiology DG Chest 2 View  Result Date: 06/01/2022 CLINICAL DATA:  Cough and fever EXAM: CHEST - 2 VIEW COMPARISON:  Chest x-ray November 27, 2015 FINDINGS: Loop recorder projecting over the left midlung. The heart size and mediastinal contours are within normal limits. Both lungs are clear. No acute osseous abnormality. The visualized upper abdomen is unremarkable. IMPRESSION: No acute cardiopulmonary disease. Electronically Signed   By: Beryle Flock M.D.   On: 06/01/2022 15:17    Procedures Procedures    Medications Ordered in ED Medications  acetaminophen (TYLENOL) tablet 650 mg (650 mg Oral Given 06/01/22 2007)  promethazine (PHENERGAN) tablet 12.5 mg (12.5 mg Oral Given 06/01/22 2007)    ED Course/ Medical Decision Making/ A&P                           Medical Decision Making Risk OTC drugs. Prescription drug management.   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 The complaint involves an extensive differential diagnosis and also carries with it a high risk of complications and morbidity.     Differential diagnosis includes but is not exclusive to subarachnoid hemorrhage, meningitis, encephalitis, previous head trauma, cavernous venous thrombosis, muscle tension headache, glaucoma, temporal arteritis, migraine or migraine equivalent, etc.  Differential diagnosis for adult fever includes but is not exclusive to community-acquired pneumonia, urinary tract infection, acute cholecystitis, viral syndrome, cellulitis, tick bourne disease,  decubitus ulcer, necrotizing fasciitis, meningitis, encephalitis, influenza, etc.   . Serious etiologies were considered.   The initial plan is to screening labs ordered in triage   Additional history obtained: Additional history obtained from family and  spouse Records reviewed  prior ED visits, prior labs and imaging, home medications CTH 10/5 unremarkable  Independent labs interpretation:  The following labs were independently interpreted:  CMP with mildly elevated creatinine, not far from his baseline.  CBC without leukocytosis, hemoglobin similar to baseline. Without infection.  Lactic acid not elevated.  Independent visualization of imaging: - I independently visualized the following imaging with scope of interpretation limited to determining acute life threatening conditions related to emergency care: Chest x-ray, which revealed no acute process  Cardiac monitoring was reviewed and interpreted by myself which shows NSR  Treatment and Reassessment: Phenergan, Tylenol >> Symptoms resolved entirely  I did offer repeat head imaging CT head but family does not feel this is necessary.  Offered RVP, family would like to defer. Pt able to tolerate PO w/o difficulty. Recommend he continue rehydration at home. He was given IVF at recent ED visit. Cr is similar to baseline.  Consultation: - Consulted or discussed management/test interpretation w/ external professional: na  Consideration for admission or further workup: Admission was considered   Patient presents with headache. Based on the patient's history and physical there is very low clinical suspicion for significant intracranial pathology. The headache was not sudden onset, not maximal at onset, there are no neurologic findings on exam, the patient does not have a fever, the patient does not have any jaw claudication, the patient does not endorse a clotting disorder, patient denies any trauma or eye pain and the headache is not associated with dizziness, weakness on one side of the body, diplopia, vertigo, slurred speech, or ataxia. Given the extremely low risk of these diagnoses further testing and evaluation for these possibilities does not appear to be indicated at this time.   The  patient improved significantly and was discharged in stable condition. Detailed discussions were had with the patient regarding current findings, and need for close f/u with PCP or on call doctor. The patient has been instructed to return immediately if the symptoms worsen in any way for re-evaluation. Patient verbalized understanding and is in agreement with current care plan. All questions answered prior to discharge.   Social Determinants of health: Social History   Tobacco Use   Smoking status: Former   Smokeless tobacco: Never   Tobacco comments:    pt quit 53 yrs ago  Vaping Use   Vaping Use: Former  Substance Use Topics   Alcohol use: Never   Drug use: Never            Final Clinical Impression(s) / ED Diagnoses Final diagnoses:  Chronic nonintractable headache, unspecified headache type    Rx / DC Orders ED Discharge Orders          Ordered    promethazine (PHENERGAN) 12.5 MG tablet  Every 8 hours PRN        06/01/22 2057              Jeanell Sparrow, DO 06/01/22 2107

## 2022-06-01 NOTE — ED Triage Notes (Signed)
Patient here w/ complaints of a headache and dizziness at rest and with movement for 3 days. Patient denies chest pain. Endorses nausea, SHOB.

## 2022-06-06 LAB — CULTURE, BLOOD (SINGLE): Culture: NO GROWTH

## 2022-07-28 ENCOUNTER — Emergency Department (HOSPITAL_COMMUNITY)
Admission: EM | Admit: 2022-07-28 | Discharge: 2022-07-29 | Discharge status: Against Medical Advice | Payer: Medicare Other | Attending: Emergency Medicine | Admitting: Emergency Medicine

## 2022-07-28 ENCOUNTER — Emergency Department (HOSPITAL_COMMUNITY): Payer: Medicare Other

## 2022-07-28 ENCOUNTER — Encounter (HOSPITAL_COMMUNITY): Payer: Self-pay

## 2022-07-28 DIAGNOSIS — R519 Headache, unspecified: Secondary | ICD-10-CM | POA: Diagnosis not present

## 2022-07-28 DIAGNOSIS — R1110 Vomiting, unspecified: Secondary | ICD-10-CM | POA: Insufficient documentation

## 2022-07-28 DIAGNOSIS — Z5321 Procedure and treatment not carried out due to patient leaving prior to being seen by health care provider: Secondary | ICD-10-CM | POA: Insufficient documentation

## 2022-07-28 DIAGNOSIS — R55 Syncope and collapse: Secondary | ICD-10-CM | POA: Insufficient documentation

## 2022-07-28 LAB — TROPONIN I (HIGH SENSITIVITY): Troponin I (High Sensitivity): 3 ng/L (ref <18)

## 2022-07-28 NOTE — ED Triage Notes (Signed)
Pt had witnessed syncopal episode with family with period of unresponsiveness followed by episode of vomiting, no complaints now

## 2022-07-28 NOTE — ED Provider Triage Note (Signed)
Emergency Medicine Provider Triage Evaluation Note  [name] , a 81 y.o. male  was evaluated in triage.  Pt complains of syncopal episode occurring between 8 PM and 830 tonight.  Episode was witnessed by family member.  Patient was eating and talking and then became silent.  Family member looked over and he was slumped to his side.  He was diaphoretic at that time.  Patient was unresponsive for a couple of minutes.  Family member blew air on him with a fan and he slowly woke up.  He did have 1 episode of vomiting afterwards.  No seizure-like activity.  Patient denies prodrome.  He has been having headaches recently but these are not worse.  No chest pain or shortness of breath.  Patient is back to his baseline.  He was seen in the emergency department and discharged in October after having a syncopal episode as well.  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   Review of Systems  Positive: Syncope Negative: Vomiting  Physical Exam  BP 134/67 (BP Location: Right Arm)   Pulse 62   Temp 97.8 F (36.6 C)   Resp 18   SpO2 100%  Gen:   Awake, no distress   Resp:  Normal effort  MSK:   Moves extremities without difficulty  Other:  Normal rate, occasional ectopy  Medical Decision Making  Medically screening exam initiated at 9:37 PM.  Appropriate orders placed.  [name] was informed that the remainder of the evaluation will be completed by another provider, this initial triage assessment does not replace that evaluation, and the importance of remaining in the ED until their evaluation is complete.     Dylan Crigler, PA-C 07/28/22 2139

## 2022-07-29 LAB — BASIC METABOLIC PANEL
Anion gap: 8 (ref 5–15)
BUN: 7 mg/dL — ABNORMAL LOW (ref 8–23)
CO2: 26 mmol/L (ref 22–32)
Calcium: 9.1 mg/dL (ref 8.9–10.3)
Chloride: 102 mmol/L (ref 98–111)
Creatinine, Ser: 1.05 mg/dL (ref 0.61–1.24)
GFR, Estimated: >60 mL/min (ref >60)
Glucose, Bld: 274 mg/dL — ABNORMAL HIGH (ref 70–99)
Potassium: 4.1 mmol/L (ref 3.5–5.1)
Sodium: 136 mmol/L (ref 135–145)

## 2022-07-29 LAB — TROPONIN I (HIGH SENSITIVITY): Troponin I (High Sensitivity): 4 ng/L (ref <18)

## 2022-07-29 NOTE — ED Notes (Signed)
Pt decided to leave while waiting for a room.  

## 2023-01-21 ENCOUNTER — Emergency Department (HOSPITAL_BASED_OUTPATIENT_CLINIC_OR_DEPARTMENT_OTHER)
Admission: EM | Admit: 2023-01-21 | Discharge: 2023-01-21 | Disposition: A | Discharge status: Home / Self Care | Payer: 59 | Attending: Emergency Medicine | Admitting: Emergency Medicine

## 2023-01-21 ENCOUNTER — Other Ambulatory Visit: Payer: Self-pay

## 2023-01-21 ENCOUNTER — Emergency Department (HOSPITAL_BASED_OUTPATIENT_CLINIC_OR_DEPARTMENT_OTHER): Payer: 59

## 2023-01-21 ENCOUNTER — Encounter (HOSPITAL_BASED_OUTPATIENT_CLINIC_OR_DEPARTMENT_OTHER): Payer: Self-pay

## 2023-01-21 DIAGNOSIS — R202 Paresthesia of skin: Secondary | ICD-10-CM | POA: Diagnosis not present

## 2023-01-21 DIAGNOSIS — R200 Anesthesia of skin: Secondary | ICD-10-CM | POA: Diagnosis present

## 2023-01-21 DIAGNOSIS — D696 Thrombocytopenia, unspecified: Secondary | ICD-10-CM | POA: Diagnosis not present

## 2023-01-21 HISTORY — DX: Syncope and collapse: R55

## 2023-01-21 HISTORY — DX: Headache, unspecified: R51.9

## 2023-01-21 HISTORY — DX: Hyperlipidemia, unspecified: E78.5

## 2023-01-21 LAB — BASIC METABOLIC PANEL
Anion gap: 9 (ref 5–15)
BUN: 18 mg/dL (ref 8–23)
CO2: 24 mmol/L (ref 22–32)
Calcium: 9 mg/dL (ref 8.9–10.3)
Chloride: 103 mmol/L (ref 98–111)
Creatinine, Ser: 1.06 mg/dL (ref 0.61–1.24)
GFR, Estimated: >60 mL/min (ref >60)
Glucose, Bld: 193 mg/dL — ABNORMAL HIGH (ref 70–99)
Potassium: 4.1 mmol/L (ref 3.5–5.1)
Sodium: 136 mmol/L (ref 135–145)

## 2023-01-21 LAB — CBC
HCT: 31.7 % — ABNORMAL LOW (ref 39.0–52.0)
Hemoglobin: 10.5 g/dL — ABNORMAL LOW (ref 13.0–17.0)
MCH: 28.7 pg (ref 26.0–34.0)
MCHC: 33.1 g/dL (ref 30.0–36.0)
MCV: 86.6 fL (ref 80.0–100.0)
Platelets: 120 10*3/uL — ABNORMAL LOW (ref 150–400)
RBC: 3.66 MIL/uL — ABNORMAL LOW (ref 4.22–5.81)
RDW: 12.2 % (ref 11.5–15.5)
WBC: 2.6 10*3/uL — ABNORMAL LOW (ref 4.0–10.5)
nRBC: 0 % (ref 0.0–0.2)

## 2023-01-21 LAB — MAGNESIUM: Magnesium: 1.6 mg/dL — ABNORMAL LOW (ref 1.7–2.4)

## 2023-01-21 LAB — CBG MONITORING, ED: Glucose-Capillary: 166 mg/dL — ABNORMAL HIGH (ref 70–99)

## 2023-01-21 MED ORDER — MAGNESIUM OXIDE -MG SUPPLEMENT 400 (240 MG) MG PO TABS: 400.0000 mg | ORAL_TABLET | Freq: Every day | ORAL | 0 refills | Status: DC

## 2023-01-21 MED ORDER — MAGNESIUM SULFATE 2 GM/50ML IV SOLN
2.0000 g | Freq: Once | INTRAVENOUS | Status: AC
  Administered 2023-01-21: 2 g via INTRAVENOUS
  Filled 2023-01-21: qty 50

## 2023-01-21 NOTE — Discharge Instructions (Signed)
1.  Take a daily magnesium pill as prescribed. 2.  Call your doctor to schedule follow-up with soon as possible.  You should get a recheck for the numbness and tingling you are experiencing.  If you get worsening symptoms or persisting symptoms, return to emergency department immediately for recheck.

## 2023-01-21 NOTE — ED Triage Notes (Addendum)
Arrived POV with wife Pt ambulatory to triage. Presents with complaint of left hand numbness. Began yesterday and then resolved . Woke up this am with hand numbness again. Pt reports hand numb, but fingers are tingly. Denies decreased sensation Mild HA, but has hx of HA x 3 years.

## 2023-01-21 NOTE — ED Provider Notes (Signed)
[place] [facility] Provider Note   CSN: [id number] Arrival date & time: 01/21/23  1010     History  Chief Complaint  Patient presents with   Numbness    [name] is a 82 y.o. male.  HPI Patient's wife is main historian.  Patient is also historian.  Patient had an episode yesterday where he had left arm numbness.  At that time he reports that it was most of his arm.  It did get better and then had a residual numbness in the tips of his fingers.  This symptom waxed and waned a couple times.  At this time patient reports that he only has some sensation of numbness in his fingertips.  He has not perceived any motor weakness no other lower extremity symptoms.  Patient does walk with a cane for balance assist.  His baseline gait and use of the cane have not changed.  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    Home Medications Prior to Admission medications   Medication Sig Start Date End Date Taking? Authorizing Provider  donepezil (ARICEPT) 10 MG tablet Take 10 mg by mouth at bedtime. 03/06/22  Yes [provider]  magnesium oxide (MAGOX 400) 400 (240 Mg) MG tablet Take 1 tablet (400 mg total) by mouth daily. 01/21/23  Yes Arby Barrette, MD  metFORMIN (GLUCOPHAGE) 1000 MG tablet Take 1,000 mg by mouth 2 (two) times daily. 02/01/22  Yes [provider]  nortriptyline (PAMELOR) 10 MG capsule Take 10 mg by mouth 2 (two) times daily. 05/27/22  Yes [provider]  tamsulosin (FLOMAX) 0.4 MG CAPS capsule Take 0.4 mg by mouth.   Yes [provider]  acetaminophen (TYLENOL) 325 MG tablet Take 325 mg by mouth every 4 (four) hours as needed for mild pain or moderate pain.    [provider]  Blood Glucose Monitoring Suppl (ACCU-CHEK GUIDE ME) w/Device KIT See admin instructions. 06/01/20   [provider]  celecoxib (CELEBREX) 200  MG capsule Take by mouth. Patient not taking: Reported on 05/30/2022 12/28/21   [provider]  diclofenac (CATAFLAM) 50 MG tablet Take 50 mg by mouth 2 (two) times daily as needed. 12/06/21   [provider]  divalproex (DEPAKOTE ER) 500 MG 24 hr tablet Take 500 mg by mouth daily. 12/06/21   [provider]  Lancets Misc. (ACCU-CHEK FASTCLIX LANCET) KIT See admin instructions. 03/21/22   [provider]  MAGNESIUM-OXIDE 400 (241.3 Mg) MG tablet Take 1 tablet by mouth See admin instructions. Take one tablet 400 (241.3 Mg) MG by mouth daily. 10/28/19   [provider]  metFORMIN (GLUCOPHAGE) 850 MG tablet Take 1 tablet (850 mg total) by mouth daily with breakfast. Patient taking differently: Take 1,000 mg by mouth daily with breakfast. 09/29/19   Layne Benton, NP  promethazine (PHENERGAN) 12.5 MG tablet Take 1 tablet (12.5 mg total) by mouth every 8 (eight) hours as needed (headache). 06/01/22   Sloan Leiter, DO      Allergies    Aspirin, Atorvastatin, Lantus [insulin glargine], and Metformin    Review of Systems   Review of Systems  Physical Exam Updated Vital Signs BP (!) 159/79   Pulse (!) 54   Temp 98.7 F (37.1 C)   Resp 19   Wt 83 kg   SpO2 100%   BMI 28.66 kg/m  Physical Exam Constitutional:  Comments: Patient is alert.  No acute distress.  Well-nourished well-developed.  HENT:     Head: Normocephalic and atraumatic.     Mouth/Throat:     Mouth: Mucous membranes are moist.     Pharynx: Oropharynx is clear.  Eyes:     Extraocular Movements: Extraocular movements intact.     Pupils: Pupils are equal, round, and reactive to light.  Cardiovascular:     Rate and Rhythm: Normal rate and regular rhythm.     Pulses: Normal pulses.     Heart sounds: Normal heart sounds.  Pulmonary:     Effort: Pulmonary effort is normal.     Breath sounds: Normal breath sounds.  Abdominal:     General: There is no distension.     Palpations:  Abdomen is soft.     Tenderness: There is no abdominal tenderness. There is no guarding.  Musculoskeletal:     Cervical back: Neck supple.     Comments: Both upper extremities are symmetric in appearance.  There is no swelling or discoloration of the left upper extremity.  Musculature is good.  Radial pulses are 2+ and strong.  Hands are warm and dry.  Lower extremities no significant peripheral edema.  Calves are soft and nontender.  Skin:    General: Skin is warm and dry.  Neurological:     Comments: Patient is alert.  He does seem to have poor recall.  His wife feels in a lot of historical elements.  His speech is clear.  He follows commands appropriately.  Motor strength upper extremities is 5\5.  He can hold both extremities in extension and pronation with no drift.  He can resist downward pressure without difficulty.  Bilateral lower extremities he can elevate each independently and hold against resistance.  Strength is 5\5.  Patient does not endorse any sensory differential to light touch left to right.  All sensory seems to be intact.  Psychiatric:        Mood and Affect: Mood normal.     ED Results / Procedures / Treatments   Labs (all labs ordered are listed, but only abnormal results are displayed) Labs Reviewed  BASIC METABOLIC PANEL - Abnormal; Notable for the following components:      Result Value   Glucose, Bld 193 (*)    All other components within normal limits  CBC - Abnormal; Notable for the following components:   WBC 2.6 (*)    RBC 3.66 (*)    Hemoglobin 10.5 (*)    HCT 31.7 (*)    Platelets 120 (*)    All other components within normal limits  MAGNESIUM - Abnormal; Notable for the following components:   Magnesium 1.6 (*)    All other components within normal limits  CBG MONITORING, ED - Abnormal; Notable for the following components:   Glucose-Capillary 166 (*)    All other components within normal limits    EKG EKG Interpretation  Date/Time:  Tuesday  Jan 21 2023 10:31:37 EDT Ventricular Rate:  63 PR Interval:  182 QRS Duration: 93 QT Interval:  410 QTC Calculation: 420 R Axis:   55 Text Interpretation: Sinus rhythm normal, no change from previous Confirmed by Arby Barrette [phone]) on 01/21/2023 11:35:04 AM  Radiology CT Head Wo Contrast  Result Date: 01/21/2023 CLINICAL DATA:  Neuro CP, acute stroke suspected. Left hand numbness. EXAM: CT HEAD WITHOUT CONTRAST TECHNIQUE: Contiguous axial images were obtained from the base of the skull through the vertex without intravenous contrast. RADIATION  DOSE REDUCTION: This exam was performed according to the departmental dose-optimization program which includes automated exposure control, adjustment of the mA and/or kV according to patient size and/or use of iterative reconstruction technique. COMPARISON:  CT examination dated May 30, 2022 FINDINGS: Brain: No evidence of acute infarction, hemorrhage, hydrocephalus, extra-axial collection or mass lesion/mass effect. Prominence of the ventricles and sulci secondary to mild generalized cerebral atrophy. Mild chronic microvascular ischemic changes of the white matter. Vascular: No hyperdense vessel or unexpected calcification. Skull: Normal. Negative for fracture or focal lesion. Sinuses/Orbits: No acute finding. Other: None. IMPRESSION: 1. No acute intracranial abnormality. 2. Mild generalized cerebral atrophy and chronic microvascular ischemic changes of the white matter. Electronically Signed   By: Larose Hires D.O.   On: 01/21/2023 12:13    Procedures Procedures    Medications Ordered in ED Medications  magnesium sulfate IVPB 2 g 50 mL (2 g Intravenous New Bag/Given 01/21/23 1359)    ED Course/ Medical Decision Making/ A&P                             Medical Decision Making Amount and/or Complexity of Data Reviewed Labs: ordered. Radiology: ordered.  Risk OTC drugs. Prescription drug management.   Patient has been having waxing and  waning numbness of the left upper extremity.  At this time he has only subjective numbness in his fingertips of all 5 digits on the left.  Motor exam is normal.  Sensory exam to light touch is normal. Differential diagnosis includes CVA\TIA\neuropathy or radiculopathy.  The metabolic derangement.  Will proceed with basic lab work and CT head.  CT head reviewed by radiology no acute findings.  White count 2.6 H&H 10.5 and 31\platelets 120.  Patient previously has had thrombocytopenia this is stable finding appropriate for continued monitoring by PCP on outpatient basis..  No evidence of any active bleeding.  Metabolic panel normal sodium 136, potassium 4.1 blood glucose 193 normal GFR.  Patient has very limited neurologic symptoms.  He is experiencing paresthesias on the left but this seems to be in the fingertips.  It is not lateralizing.  Seems of much lower probability to be stroke related.  Possibly a compressive neuropathy although patient does not endorse any neck pain and does not have any objective weakness.  At this time does not need further imaging such as MRI of the neck.  He may need this if symptoms advance.  Magnesium was low.  Magnesium replaced IV.  Patient had previously been prescribed magnesium.  At this time we will reinstitute oral magnesium supplementation and have the patient follow-up with PCP for monitoring.  Is alert and in no distress.  Vital signs are stable.  Stable for discharge.  Return precautions included.        Final Clinical Impression(s) / ED Diagnoses Final diagnoses:  Paresthesia  Hypomagnesemia    Rx / DC Orders ED Discharge Orders          Ordered    magnesium oxide (MAGOX 400) 400 (240 Mg) MG tablet  Daily        01/21/23 1454              Arby Barrette, MD 01/21/23 1501

## 2023-01-30 ENCOUNTER — Encounter (HOSPITAL_BASED_OUTPATIENT_CLINIC_OR_DEPARTMENT_OTHER): Payer: Self-pay | Admitting: Emergency Medicine

## 2023-01-30 ENCOUNTER — Emergency Department (HOSPITAL_BASED_OUTPATIENT_CLINIC_OR_DEPARTMENT_OTHER): Payer: Medicare PPO

## 2023-01-30 ENCOUNTER — Emergency Department (HOSPITAL_BASED_OUTPATIENT_CLINIC_OR_DEPARTMENT_OTHER)
Admission: EM | Admit: 2023-01-30 | Discharge: 2023-01-30 | Disposition: A | Discharge status: Home / Self Care | Payer: Medicare PPO | Attending: Emergency Medicine | Admitting: Emergency Medicine

## 2023-01-30 ENCOUNTER — Other Ambulatory Visit: Payer: Self-pay

## 2023-01-30 DIAGNOSIS — R599 Enlarged lymph nodes, unspecified: Secondary | ICD-10-CM | POA: Insufficient documentation

## 2023-01-30 DIAGNOSIS — Z79899 Other long term (current) drug therapy: Secondary | ICD-10-CM | POA: Diagnosis not present

## 2023-01-30 DIAGNOSIS — R591 Generalized enlarged lymph nodes: Secondary | ICD-10-CM

## 2023-01-30 DIAGNOSIS — I10 Essential (primary) hypertension: Secondary | ICD-10-CM | POA: Insufficient documentation

## 2023-01-30 DIAGNOSIS — R079 Chest pain, unspecified: Secondary | ICD-10-CM | POA: Insufficient documentation

## 2023-01-30 DIAGNOSIS — E119 Type 2 diabetes mellitus without complications: Secondary | ICD-10-CM | POA: Insufficient documentation

## 2023-01-30 DIAGNOSIS — Z794 Long term (current) use of insulin: Secondary | ICD-10-CM | POA: Diagnosis not present

## 2023-01-30 DIAGNOSIS — Z7984 Long term (current) use of oral hypoglycemic drugs: Secondary | ICD-10-CM | POA: Insufficient documentation

## 2023-01-30 LAB — CBC
HCT: 32.9 % — ABNORMAL LOW (ref 39.0–52.0)
Hemoglobin: 11 g/dL — ABNORMAL LOW (ref 13.0–17.0)
MCH: 28.8 pg (ref 26.0–34.0)
MCHC: 33.4 g/dL (ref 30.0–36.0)
MCV: 86.1 fL (ref 80.0–100.0)
Platelets: 128 10*3/uL — ABNORMAL LOW (ref 150–400)
RBC: 3.82 MIL/uL — ABNORMAL LOW (ref 4.22–5.81)
RDW: 12.1 % (ref 11.5–15.5)
WBC: 3.1 10*3/uL — ABNORMAL LOW (ref 4.0–10.5)
nRBC: 0 % (ref 0.0–0.2)

## 2023-01-30 LAB — COMPREHENSIVE METABOLIC PANEL
ALT: 13 U/L (ref 0–44)
AST: 25 U/L (ref 15–41)
Albumin: 4 g/dL (ref 3.5–5.0)
Alkaline Phosphatase: 47 U/L (ref 38–126)
Anion gap: 10 (ref 5–15)
BUN: 14 mg/dL (ref 8–23)
CO2: 22 mmol/L (ref 22–32)
Calcium: 8.9 mg/dL (ref 8.9–10.3)
Chloride: 102 mmol/L (ref 98–111)
Creatinine, Ser: 1.11 mg/dL (ref 0.61–1.24)
GFR, Estimated: >60 mL/min (ref >60)
Glucose, Bld: 224 mg/dL — ABNORMAL HIGH (ref 70–99)
Potassium: 3.9 mmol/L (ref 3.5–5.1)
Sodium: 134 mmol/L — ABNORMAL LOW (ref 135–145)
Total Bilirubin: 0.4 mg/dL (ref 0.3–1.2)
Total Protein: 7.4 g/dL (ref 6.5–8.1)

## 2023-01-30 LAB — D-DIMER, QUANTITATIVE: D-Dimer, Quant: 0.77 ug/mL-FEU — ABNORMAL HIGH (ref 0.00–0.50)

## 2023-01-30 LAB — TROPONIN I (HIGH SENSITIVITY)
Troponin I (High Sensitivity): 3 ng/L (ref <18)
Troponin I (High Sensitivity): 3 ng/L (ref <18)

## 2023-01-30 LAB — LIPASE, BLOOD: Lipase: 29 U/L (ref 11–51)

## 2023-01-30 MED ORDER — IOHEXOL 350 MG/ML SOLN
75.0000 mL | Freq: Once | INTRAVENOUS | Status: AC | PRN
  Administered 2023-01-30: 75 mL via INTRAVENOUS

## 2023-01-30 MED ORDER — SODIUM CHLORIDE 0.9 % IV BOLUS
1000.0000 mL | Freq: Once | INTRAVENOUS | Status: AC
  Administered 2023-01-30: 1000 mL via INTRAVENOUS

## 2023-01-30 NOTE — ED Provider Notes (Signed)
[place] [facility] Provider Note   CSN: [id number] Arrival date & time: 01/30/23  1327     History  Chief Complaint  Patient presents with   Chest Pain    [name] is a 82 y.o. male.  Patient is an 82 year old male with a history of hypertension, hyperlipidemia, diabetes, recurrent headaches and prior syncope who is presenting today with a sharp pain in his right chest and under his axilla.  Patient reports the first time he felt the pain was last night after having some ice cream.  His wife states that she crushed up some Tums and gave it to him and with about 15 minutes the pain resolved.  He did have 1 episode of emesis with the pain but reports he felt better he went to bed and he felt fine this morning until this afternoon approximately 30 minutes before arrival the pain returned in the same spot.  It was about 45 minutes to an hour after he had had lunch.  He denied any cough, shortness of breath and denied any nausea or vomiting today.  He reports on onset the pain was severe but now it is starting to improve without any intervention.  He denies any abdominal pain or change in bowel habits.  No shortness of breath.  The history is provided by the patient, the spouse and medical records.  Chest Pain      Home Medications Prior to Admission medications   Medication Sig Start Date End Date Taking? Authorizing Provider  acetaminophen (TYLENOL) 325 MG tablet Take 325 mg by mouth every 4 (four) hours as needed for mild pain or moderate pain.    [provider]  Blood Glucose Monitoring Suppl (ACCU-CHEK GUIDE ME) w/Device KIT See admin instructions. 06/01/20   [provider]  celecoxib (CELEBREX) 200 MG capsule Take by mouth. Patient not taking: Reported on 05/30/2022 12/28/21   [provider]  diclofenac (CATAFLAM) 50 MG tablet Take 50 mg by mouth 2 (two) times daily as needed. 12/06/21   [provider]   divalproex (DEPAKOTE ER) 500 MG 24 hr tablet Take 500 mg by mouth daily. 12/06/21   [provider]  donepezil (ARICEPT) 10 MG tablet Take 10 mg by mouth at bedtime. 03/06/22   [provider]  Lancets Misc. (ACCU-CHEK FASTCLIX LANCET) KIT See admin instructions. 03/21/22   [provider]  magnesium oxide (MAGOX 400) 400 (240 Mg) MG tablet Take 1 tablet (400 mg total) by mouth daily. 01/21/23   Arby Barrette, MD  MAGNESIUM-OXIDE 400 (241.3 Mg) MG tablet Take 1 tablet by mouth See admin instructions. Take one tablet 400 (241.3 Mg) MG by mouth daily. 10/28/19   [provider]  metFORMIN (GLUCOPHAGE) 1000 MG tablet Take 1,000 mg by mouth 2 (two) times daily. 02/01/22   [provider]  metFORMIN (GLUCOPHAGE) 850 MG tablet Take 1 tablet (850 mg total) by mouth daily with breakfast. Patient taking differently: Take 1,000 mg by mouth daily with breakfast. 09/29/19   Layne Benton, NP  nortriptyline (PAMELOR) 10 MG capsule Take 10 mg by mouth 2 (two) times daily. 05/27/22   [provider]  promethazine (PHENERGAN) 12.5 MG tablet Take 1 tablet (12.5 mg total) by mouth every 8 (eight) hours as needed (headache). 06/01/22   Sloan Leiter, DO  tamsulosin (FLOMAX) 0.4 MG CAPS capsule Take 0.4 mg by mouth.    [provider]      Allergies  Aspirin, Atorvastatin, Insulin glargine, Metformin, and Amoxicillin    Review of Systems   Review of Systems  Cardiovascular:  Positive for chest pain.    Physical Exam Updated Vital Signs BP 120/69 (BP Location: Right Arm)   Pulse 77   Temp 98 F (36.7 C) (Oral)   Resp 16   Ht 5\' 7"  (1.702 m)   Wt 74.8 kg   SpO2 99%   BMI 25.84 kg/m  Physical Exam Vitals and nursing note reviewed.  Constitutional:      General: He is not in acute distress.    Appearance: He is well-developed.  HENT:     Head: Normocephalic and atraumatic.  Eyes:     Conjunctiva/sclera: Conjunctivae normal.     Pupils:  Pupils are equal, round, and reactive to light.  Cardiovascular:     Rate and Rhythm: Normal rate and regular rhythm.     Heart sounds: No murmur heard. Pulmonary:     Effort: Pulmonary effort is normal. No respiratory distress.     Breath sounds: Normal breath sounds. No wheezing or rales.     Comments: No rashes noted on the chest Chest:     Chest wall: No tenderness.  Abdominal:     General: There is no distension.     Palpations: Abdomen is soft.     Tenderness: There is no abdominal tenderness. There is no guarding or rebound.  Musculoskeletal:        General: No tenderness. Normal range of motion.     Cervical back: Normal range of motion and neck supple.  Skin:    General: Skin is warm and dry.     Findings: No erythema or rash.  Neurological:     Mental Status: He is alert and oriented to person, place, and time.  Psychiatric:        Behavior: Behavior normal.     ED Results / Procedures / Treatments   Labs (all labs ordered are listed, but only abnormal results are displayed) Labs Reviewed  CBC - Abnormal; Notable for the following components:      Result Value   WBC 3.1 (*)    RBC 3.82 (*)    Hemoglobin 11.0 (*)    HCT 32.9 (*)    Platelets 128 (*)    All other components within normal limits  D-DIMER, QUANTITATIVE - Abnormal; Notable for the following components:   D-Dimer, Quant 0.77 (*)    All other components within normal limits  COMPREHENSIVE METABOLIC PANEL - Abnormal; Notable for the following components:   Sodium 134 (*)    Glucose, Bld 224 (*)    All other components within normal limits  LIPASE, BLOOD  TROPONIN I (HIGH SENSITIVITY)  TROPONIN I (HIGH SENSITIVITY)    EKG EKG Interpretation  Date/Time:  Thursday January 30 2023 13:36:35 EDT Ventricular Rate:  77 PR Interval:  165 QRS Duration: 87 QT Interval:  377 QTC Calculation: 427 R Axis:   60 Text Interpretation: Sinus rhythm No significant change since last tracing Confirmed by  Gwyneth Sprout ([id number]) on 01/30/2023 1:56:47 PM  Radiology DG Chest 2 View  Result Date: 01/30/2023 CLINICAL DATA:  Chest pain EXAM: CHEST - 2 VIEW COMPARISON:  X-ray 07/28/2022 and older FINDINGS: Loop recorder overlying the lower left thorax. No consolidation, pneumothorax or effusion. No edema. Normal cardiopericardial silhouette. Degenerative changes of the spine. IMPRESSION: No acute cardiopulmonary disease.  Loop recorder. Electronically Signed   By: Karen Kays M.D.   On: 01/30/2023 14:33  Procedures Procedures    Medications Ordered in ED Medications - No data to display  ED Course/ Medical Decision Making/ A&P                             Medical Decision Making Amount and/or Complexity of Data Reviewed Independent Historian: spouse Labs: ordered. Decision-making details documented in ED Course. Radiology: ordered and independent interpretation performed. Decision-making details documented in ED Course. ECG/medicine tests: ordered and independent interpretation performed. Decision-making details documented in ED Course.   Pt with multiple medical problems and comorbidities and presenting today with a complaint that caries a high risk for morbidity and mortality.  Here today with nonspecific right-sided chest and axillary pain.  No evidence of shingles patient denies any trauma and low suspicion for fracture.  He has not had URI symptoms suggestive of pneumonia or bronchitis.  On exam patient's breath sounds are clear he has no history of COPD or tobacco use and low suspicion for reactive airways.  Patient has no abdominal pain at this time but possibility for cholecystitis or choledocholithiasis.  Also could be pleurisy, PE, lower suspicion for ACS.  I independently interpreted patient's EKG and labs.  EKG within normal limits, D-dimer today is negative age-adjusted and patient is not high risk he has had no recent immobilization including surgeries or long travel no unilateral  leg pain or swelling and no prior history of clot.  CMP with hyperglycemia but no other acute findings normal renal function electrolytes, lipase within normal limits.  CBC with a leukopenia which is unchanged from prior and stable hemoglobin of 11 and initial troponin is negative at 3.  Since patient came in within 30 minutes of having pain will need a delta troponin.  I have independently visualized and interpreted pt's images today.  Chest x-ray with a loop recorder but no other acute findings.          Final Clinical Impression(s) / ED Diagnoses Final diagnoses:  None    Rx / DC Orders ED Discharge Orders     None         Gwyneth Sprout, MD 01/30/23 1547

## 2023-01-30 NOTE — ED Provider Notes (Signed)
82 yo male presenting with right sided chest pain, onset last night, improved with TUMS However symptoms resumed today within an hour of arrival  Age adjusted ddimer negative, trop 3, pending 2nd trop   Physical Exam  BP (!) 170/77 (BP Location: Left Arm)   Pulse (!) 57   Temp 97.6 F (36.4 C) (Oral)   Resp 13   Ht 5\' 7"  (1.702 m)   Wt 74.8 kg   SpO2 100%   BMI 25.84 kg/m   Physical Exam  Procedures  Procedures  ED Course / MDM   Clinical Course as of 01/30/23 1815  Thu Jan 30, 2023  1732 Patient reassessed reporting is not having active chest pain.  Awaiting CT imaging results. [MT]    Clinical Course User Index [MT] Carrie Schoonmaker, Kermit Balo, MD   Medical Decision Making Amount and/or Complexity of Data Reviewed Labs: ordered. Radiology: ordered.  Risk Prescription drug management.   CT scan reviewed with no active or acute PE, no evidence of infection.  Patient does have some lymphadenopathy.  Upon clarification with the patient and his wife at bedside he reports he has had "cold symptoms" for a week.  I suspect these are likely reactive to a viral syndrome.  However he will need to follow-up with his PCP, and this was delineated in his instructions.  I do suspect he was likely symptomatic from this axillary lymph node.       Terald Sleeper, MD 01/30/23 [phone]

## 2023-01-30 NOTE — ED Triage Notes (Signed)
Right sided chest pain that started 1 hour pta. Denies radiation. Denies other sx. Describes pain as intermittent, 10/10.

## 2023-01-30 NOTE — Discharge Instructions (Addendum)
Your CT scan did not show signs of a blood clot, collapsed lung, pneumonia, or other life-threatening condition.  I do have reactive lymph nodes seen near your right armpit and under your collarbone, which is likely related to a virus or an infection.  I recommend follow-up with your primary care clinic for this issue.  Most of the time these lymph nodes improve your swelling with time.  However, if the lymph node stays swollen or tender, you may need further workup done, including a cancer workup as an outpatient.

## 2023-01-30 NOTE — ED Notes (Signed)
ED Provider at bedside. 

## 2023-11-30 ENCOUNTER — Emergency Department (HOSPITAL_COMMUNITY)

## 2023-11-30 ENCOUNTER — Encounter (HOSPITAL_COMMUNITY): Payer: Self-pay

## 2023-11-30 ENCOUNTER — Other Ambulatory Visit: Payer: Self-pay

## 2023-11-30 ENCOUNTER — Emergency Department (HOSPITAL_COMMUNITY)
Admission: EM | Admit: 2023-11-30 | Discharge: 2023-11-30 | Disposition: A | Discharge status: Home / Self Care | Attending: Emergency Medicine | Admitting: Emergency Medicine

## 2023-11-30 DIAGNOSIS — R519 Headache, unspecified: Secondary | ICD-10-CM | POA: Insufficient documentation

## 2023-11-30 LAB — SEDIMENTATION RATE: Sed Rate: 9 mm/h (ref 0–16)

## 2023-11-30 LAB — CBC WITH DIFFERENTIAL/PLATELET
Abs Immature Granulocytes: 0.01 10*3/uL (ref 0.00–0.07)
Basophils Absolute: 0 10*3/uL (ref 0.0–0.1)
Basophils Relative: 0 %
Eosinophils Absolute: 0 10*3/uL (ref 0.0–0.5)
Eosinophils Relative: 1 %
HCT: 34.7 % — ABNORMAL LOW (ref 39.0–52.0)
Hemoglobin: 11.2 g/dL — ABNORMAL LOW (ref 13.0–17.0)
Immature Granulocytes: 0 %
Lymphocytes Relative: 58 %
Lymphs Abs: 1.5 10*3/uL (ref 0.7–4.0)
MCH: 28.4 pg (ref 26.0–34.0)
MCHC: 32.3 g/dL (ref 30.0–36.0)
MCV: 88.1 fL (ref 80.0–100.0)
Monocytes Absolute: 0.2 10*3/uL (ref 0.1–1.0)
Monocytes Relative: 9 %
Neutro Abs: 0.8 10*3/uL — ABNORMAL LOW (ref 1.7–7.7)
Neutrophils Relative %: 32 %
Platelets: 115 10*3/uL — ABNORMAL LOW (ref 150–400)
RBC: 3.94 MIL/uL — ABNORMAL LOW (ref 4.22–5.81)
RDW: 12.4 % (ref 11.5–15.5)
WBC: 2.6 10*3/uL — ABNORMAL LOW (ref 4.0–10.5)
nRBC: 0 % (ref 0.0–0.2)

## 2023-11-30 LAB — BASIC METABOLIC PANEL WITH GFR
Anion gap: 9 (ref 5–15)
BUN: 12 mg/dL (ref 8–23)
CO2: 22 mmol/L (ref 22–32)
Calcium: 8.8 mg/dL — ABNORMAL LOW (ref 8.9–10.3)
Chloride: 105 mmol/L (ref 98–111)
Creatinine, Ser: 1.07 mg/dL (ref 0.61–1.24)
GFR, Estimated: >60 mL/min (ref >60)
Glucose, Bld: 220 mg/dL — ABNORMAL HIGH (ref 70–99)
Potassium: 3.5 mmol/L (ref 3.5–5.1)
Sodium: 136 mmol/L (ref 135–145)

## 2023-11-30 NOTE — ED Provider Notes (Signed)
 [place] EMERGENCY DEPARTMENT AT [facility] Provider Note   CSN: [id number] Arrival date & time: 11/30/23  9147     History  Chief Complaint  Patient presents with   Headache    [name] is a 83 y.o. male.  HPI 83 year old male presents with a right-sided headache.  Patient has been having a headache on and off for the last couple days.  Seems to come and go without obvious cause.  Right now he has a moderate temporal headache.  He denies any vision symptoms, ocular pain, jaw pain.  No pain with chewing.  Tylenol seems to help.  No vomiting, neck pain or stiffness, or weakness/numbness in the extremities.  Home Medications Prior to Admission medications   Medication Sig Start Date End Date Taking? Authorizing Provider  acetaminophen (TYLENOL) 325 MG tablet Take 325 mg by mouth every 4 (four) hours as needed for mild pain or moderate pain.   Yes [provider]  donepezil (ARICEPT) 10 MG tablet Take 10 mg by mouth at bedtime. 03/06/22  Yes [provider]  glipiZIDE 2.5 MG TABS Take 1 tablet by mouth daily. 11/05/23  Yes [provider]  LANTUS SOLOSTAR 100 UNIT/ML Solostar Pen Inject 15 Units into the skin daily.   Yes [provider]  metFORMIN (GLUCOPHAGE-XR) 500 MG 24 hr tablet Take 500 mg by mouth daily with breakfast.   Yes [provider]  tamsulosin (FLOMAX) 0.4 MG CAPS capsule Take 0.4 mg by mouth.   Yes [provider]  Blood Glucose Monitoring Suppl (ACCU-CHEK GUIDE ME) w/Device KIT See admin instructions. 06/01/20   [provider]  Lancets Misc. (ACCU-CHEK FASTCLIX LANCET) KIT See admin instructions. 03/21/22   [provider]      Allergies    Aspirin, Atorvastatin, Insulin glargine, Metformin, and Amoxicillin    Review of Systems   Review of Systems  Constitutional:  Negative for fever.  Eyes:  Negative for visual disturbance.  Gastrointestinal:  Negative for vomiting.   Musculoskeletal:  Negative for neck pain and neck stiffness.  Neurological:  Positive for headaches. Negative for weakness and numbness.    Physical Exam Updated Vital Signs BP (!) 154/83   Pulse (!) 58   Temp 97.6 F (36.4 C) (Oral)   Resp 18   Ht 5\' 7"  (1.702 m)   Wt 75 kg   SpO2 100%   BMI 25.90 kg/m  Physical Exam Vitals and nursing note reviewed.  Constitutional:      Appearance: He is well-developed.  HENT:     Head: Normocephalic and atraumatic.   Eyes:     Extraocular Movements: Extraocular movements intact.     Pupils: Pupils are equal, round, and reactive to light.  Cardiovascular:     Rate and Rhythm: Normal rate and regular rhythm.     Heart sounds: Normal heart sounds.  Pulmonary:     Effort: Pulmonary effort is normal.     Breath sounds: Normal breath sounds.  Musculoskeletal:     Cervical back: Normal range of motion.  Skin:    General: Skin is warm and dry.  Neurological:     Mental Status: He is alert.     Comments: CN 3-12 grossly intact. 5/5 strength in all 4 extremities. Grossly normal sensation. Normal finger to nose.      ED Results / Procedures / Treatments   Labs (all labs ordered are listed, but only abnormal results are displayed) Labs Reviewed  BASIC METABOLIC PANEL  WITH GFR - Abnormal; Notable for the following components:      Result Value   Glucose, Bld 220 (*)    Calcium 8.8 (*)    All other components within normal limits  CBC WITH DIFFERENTIAL/PLATELET - Abnormal; Notable for the following components:   WBC 2.6 (*)    RBC 3.94 (*)    Hemoglobin 11.2 (*)    HCT 34.7 (*)    Platelets 115 (*)    Neutro Abs 0.8 (*)    All other components within normal limits  SEDIMENTATION RATE    EKG None  Radiology CT Head Wo Contrast Result Date: 11/30/2023 CLINICAL DATA:  83 year old male with persistent headache for 3 days, dizziness. Hand numbness. EXAM: CT HEAD WITHOUT CONTRAST TECHNIQUE: Contiguous axial images were obtained  from the base of the skull through the vertex without intravenous contrast. RADIATION DOSE REDUCTION: This exam was performed according to the departmental dose-optimization program which includes automated exposure control, adjustment of the mA and/or kV according to patient size and/or use of iterative reconstruction technique. COMPARISON:  Brain MRI 03/26/2022.  Head CT 01/21/2023. FINDINGS: Brain: Stable cerebral volume. No midline shift, ventriculomegaly, mass effect, evidence of mass lesion, intracranial hemorrhage or evidence of cortically based acute infarction. Gray-white differentiation is stable and within normal limits for age. Vascular: No suspicious intracranial vascular hyperdensity. Calcified atherosclerosis at the skull base. Skull: Intact, negative. Sinuses/Orbits: Visualized paranasal sinuses and mastoids are stable and well aerated. Other: No acute orbit or scalp soft tissue finding. IMPRESSION: Stable and normal for age noncontrast Head CT. Electronically Signed   By: Odessa Fleming M.D.   On: 11/30/2023 10:08    Procedures Procedures    Medications Ordered in ED Medications - No data to display  ED Course/ Medical Decision Making/ A&P                                 Medical Decision Making Amount and/or Complexity of Data Reviewed Labs: ordered.    Details: Chronically low WBC.  Normal sed rate Radiology: ordered and independent interpretation performed.    Details: No head bleed   Patient was right-sided headache of unclear etiology.  He does have redness over the right temple but also over the right side of his scalp as well.  No rash seen.  Neuroexam is unremarkable.  No ocular symptoms.  No jaw claudication.  While it is on his temple, he otherwise does not have obvious signs/symptoms of temporal arteritis and with a completely normal sed rate I think this is unlikely.  He has declined thing for pain and has minimal pain right now.  Will refer him to his PCP but low suspicion  for acute neuro emergency or temporal arteritis.  No infectious symptoms or meningismus.        Final Clinical Impression(s) / ED Diagnoses Final diagnoses:  Right-sided headache    Rx / DC Orders ED Discharge Orders     None         Pricilla Loveless, MD 11/30/23 1423

## 2023-11-30 NOTE — ED Triage Notes (Signed)
 Patient has had a headache for 3 days. Feels a stabbing pain behind his eyes. Feels dizzy when he gets a headache. Complaining of bilateral hand numbness but the left hand is more numb. No vomiting.

## 2023-11-30 NOTE — Discharge Instructions (Signed)
 If you develop continued, recurrent, or worsening headache, fever, neck stiffness, vomiting, blurry or double vision, weakness or numbness in your arms or legs, trouble speaking, or any other new/concerning symptoms then return to the ER for evaluation.

## 2024-01-25 ENCOUNTER — Emergency Department (HOSPITAL_COMMUNITY)
Admission: EM | Admit: 2024-01-25 | Discharge: 2024-01-25 | Disposition: A | Discharge status: Home / Self Care | Attending: Emergency Medicine | Admitting: Emergency Medicine

## 2024-01-25 ENCOUNTER — Emergency Department (HOSPITAL_COMMUNITY)

## 2024-01-25 DIAGNOSIS — R109 Unspecified abdominal pain: Secondary | ICD-10-CM | POA: Diagnosis present

## 2024-01-25 DIAGNOSIS — R1011 Right upper quadrant pain: Secondary | ICD-10-CM | POA: Diagnosis not present

## 2024-01-25 LAB — URINALYSIS, ROUTINE W REFLEX MICROSCOPIC
Bilirubin Urine: NEGATIVE
Glucose, UA: 150 mg/dL — AB
Hgb urine dipstick: NEGATIVE
Ketones, ur: NEGATIVE mg/dL
Leukocytes,Ua: NEGATIVE
Nitrite: NEGATIVE
Protein, ur: NEGATIVE mg/dL
Specific Gravity, Urine: 1.01 (ref 1.005–1.030)
pH: 5 (ref 5.0–8.0)

## 2024-01-25 LAB — CBC
HCT: 38.1 % — ABNORMAL LOW (ref 39.0–52.0)
Hemoglobin: 12.2 g/dL — ABNORMAL LOW (ref 13.0–17.0)
MCH: 28.5 pg (ref 26.0–34.0)
MCHC: 32 g/dL (ref 30.0–36.0)
MCV: 89 fL (ref 80.0–100.0)
Platelets: 126 10*3/uL — ABNORMAL LOW (ref 150–400)
RBC: 4.28 MIL/uL (ref 4.22–5.81)
RDW: 12.1 % (ref 11.5–15.5)
WBC: 4.1 10*3/uL (ref 4.0–10.5)
nRBC: 0 % (ref 0.0–0.2)

## 2024-01-25 LAB — LIPASE, BLOOD: Lipase: 61 U/L — ABNORMAL HIGH (ref 11–51)

## 2024-01-25 LAB — HEPATIC FUNCTION PANEL
ALT: 11 U/L (ref 0–44)
AST: 18 U/L (ref 15–41)
Albumin: 4.1 g/dL (ref 3.5–5.0)
Alkaline Phosphatase: 51 U/L (ref 38–126)
Bilirubin, Direct: <0.1 mg/dL (ref 0.0–0.2)
Total Bilirubin: 1.2 mg/dL (ref 0.0–1.2)
Total Protein: 7.5 g/dL (ref 6.5–8.1)

## 2024-01-25 LAB — BASIC METABOLIC PANEL WITH GFR
Anion gap: 4 — ABNORMAL LOW (ref 5–15)
BUN: 14 mg/dL (ref 8–23)
CO2: 28 mmol/L (ref 22–32)
Calcium: 9.2 mg/dL (ref 8.9–10.3)
Chloride: 103 mmol/L (ref 98–111)
Creatinine, Ser: 1.05 mg/dL (ref 0.61–1.24)
GFR, Estimated: >60 mL/min (ref >60)
Glucose, Bld: 225 mg/dL — ABNORMAL HIGH (ref 70–99)
Potassium: 3.5 mmol/L (ref 3.5–5.1)
Sodium: 135 mmol/L (ref 135–145)

## 2024-01-25 NOTE — ED Provider Notes (Signed)
 [name] EMERGENCY DEPARTMENT AT [facility] Provider Note   CSN: [id number] Arrival date & time: 01/25/24  1191     History  Chief Complaint  Patient presents with   Flank Pain    [name] is a 83 y.o. male.  HPI Patient has had a focal area of pain in the right flank for about a week.  It is precipitated by certain movements.  He has described it as a fullness sensation.  Patient does not have any fevers or chills.  No nausea or vomiting.  He has been eating as per normal.  No pain burning urgency with urination.  He has been taking over-the-counter Tylenol  for pain relief.    Home Medications Prior to Admission medications   Medication Sig Start Date End Date Taking? Authorizing Provider  acetaminophen  (TYLENOL ) 325 MG tablet Take 325 mg by mouth every 4 (four) hours as needed for mild pain or moderate pain.    [provider]  Blood Glucose Monitoring Suppl (ACCU-CHEK GUIDE ME) w/Device KIT See admin instructions. 06/01/20   [provider]  donepezil (ARICEPT) 10 MG tablet Take 10 mg by mouth at bedtime. 03/06/22   [provider]  glipiZIDE 2.5 MG TABS Take 1 tablet by mouth daily. 11/05/23   [provider]  Lancets Misc. (ACCU-CHEK FASTCLIX LANCET) KIT See admin instructions. 03/21/22   [provider]  LANTUS SOLOSTAR 100 UNIT/ML Solostar Pen Inject 15 Units into the skin daily.    [provider]  metFORMIN  (GLUCOPHAGE -XR) 500 MG 24 hr tablet Take 500 mg by mouth daily with breakfast.    [provider]  tamsulosin (FLOMAX) 0.4 MG CAPS capsule Take 0.4 mg by mouth.    [provider]      Allergies    Aspirin, Atorvastatin , Insulin  glargine, Metformin , and Amoxicillin    Review of Systems   Review of Systems  Physical Exam Updated Vital Signs BP 136/82 (BP Location: Left Arm)   Pulse 78   Temp 97.7 F (36.5 C) (Oral)   Resp 16   SpO2 100%  Physical Exam Constitutional:       Comments: Alert nontoxic no acute distress.  No respiratory distress.  Pleasant and answering questions.  HENT:     Head: Normocephalic and atraumatic.     Mouth/Throat:     Pharynx: Oropharynx is clear.  Eyes:     Extraocular Movements: Extraocular movements intact.  Cardiovascular:     Rate and Rhythm: Normal rate and regular rhythm.  Pulmonary:     Effort: Pulmonary effort is normal.     Breath sounds: Normal breath sounds.  Abdominal:     General: There is no distension.     Palpations: Abdomen is soft.     Tenderness: There is no abdominal tenderness. There is no guarding.     Comments: Soft tissues of the abdomen and chest and thoracic chest wall are normal.  No rashes or soft tissue abnormalities.  Musculoskeletal:        General: No swelling or tenderness. Normal range of motion.     Right lower leg: No edema.     Left lower leg: No edema.  Skin:    General: Skin is warm and dry.  Neurological:     Comments: Patient is alert.  He is assisting in answering questions.  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  No  focal motor deficits.  Affect is slightly flat.     ED Results / Procedures / Treatments   Labs (all labs ordered are listed, but only abnormal results are displayed) Labs Reviewed  URINALYSIS, ROUTINE W REFLEX MICROSCOPIC - Abnormal; Notable for the following components:      Result Value   Glucose, UA 150 (*)    All other components within normal limits  BASIC METABOLIC PANEL WITH GFR - Abnormal; Notable for the following components:   Glucose, Bld 225 (*)    Anion gap 4 (*)    All other components within normal limits  CBC - Abnormal; Notable for the following components:   Hemoglobin 12.2 (*)    HCT 38.1 (*)    Platelets 126 (*)    All other components within normal limits  LIPASE, BLOOD - Abnormal; Notable for the following components:    Lipase 61 (*)    All other components within normal limits  HEPATIC FUNCTION PANEL    EKG None  Radiology CT Renal Stone Study Result Date: 01/25/2024 CLINICAL DATA:  Abdominal/flank pain, stone suspected right flank EXAM: CT ABDOMEN AND PELVIS WITHOUT CONTRAST TECHNIQUE: Multidetector CT imaging of the abdomen and pelvis was performed following the standard protocol without IV contrast. RADIATION DOSE REDUCTION: This exam was performed according to the departmental dose-optimization program which includes automated exposure control, adjustment of the mA and/or kV according to patient size and/or use of iterative reconstruction technique. COMPARISON:  None Available. FINDINGS: Of note, the lack of intravenous contrast limits evaluation of the solid organ parenchyma and vascularity. Lower chest: No focal airspace consolidation or pleural effusion. Posterior bibasilar dependent atelectasis. 7 mm subpleural nodule in the left lower lobe Hepatobiliary: No mass.No radiopaque stones or wall thickening of the gallbladder. No intrahepatic or extrahepatic biliary ductal dilation. Pancreas: No mass or main ductal dilation. No peripancreatic inflammation or fluid collection. Spleen: Normal size. No mass. Adrenals/Urinary Tract: No adrenal masses. No renal mass. Small 5 mm nonobstructive left lower pole calculus. No hydronephrosis in either kidney. The urinary bladder is distended without focal abnormality. Stomach/Bowel: The stomach is decompressed without focal abnormality. No small bowel wall thickening or inflammation. No small bowel obstruction.Normal appendix. Vascular/Lymphatic: No aortic aneurysm. No intraabdominal or pelvic lymphadenopathy. Reproductive: Mild prostatomegaly.  No free pelvic fluid. Other: No pneumoperitoneum, ascites, or mesenteric inflammation. Musculoskeletal: No acute fracture or destructive lesion. Small volume symmetric bilateral gynecomastia. IMPRESSION: 1. No acute intra-abdominal or  pelvic abnormality. 2. Nonobstructive calculus in the left lower pole. No hydronephrosis in either kidney. 3. Mild prostatomegaly. 4. Subpleural nodule in the left lower lobe, measuring 7 mm. Follow-up should be considered, as documented below. Non-contrast chest CT at 6-12 months is recommended. If the nodule is stable at time of repeat CT, then future CT at 18-24 months (from today's scan) is considered optional for low-risk patients, but is recommended for high-risk patients. This recommendation follows the consensus statement: Guidelines for Management of Incidental Pulmonary Nodules Detected on CT Images: From the Fleischner Society 2017; Radiology 2017; 284:228-243. Electronically Signed   By: Rance Burrows M.D.   On: 01/25/2024 10:06    Procedures Procedures    Medications Ordered in ED Medications - No data to display  ED Course/ Medical Decision Making/ A&P                                 Medical Decision Making Amount and/or Complexity of Data  Reviewed Labs: ordered. Radiology: ordered.   Patient presents as outlined.  He has been experiencing a right upper quadrant and flank pain for about a week.  Predominantly positional but also persistent fullness sensation.  Will proceed with diagnostic evaluation to rule out pneumonia\biliary colic\cholecystitis.  Vital signs are stable with patient normotensive, afebrile, no tachycardia or hypoxia.  Hepatic function normal.  Lipase 61.  Metabolic panel normal except glucose 225.  White count 4.1 H&H 12 and 38 platelets 126 urinalysis negative except for glucose.  CT stone study: No acute findings.  Incidental nodule for observation of the left lower lung and incidental intrarenal kidney stone left.  Noncontributory to current symptoms.  Appropriate for continued outpatient follow-up.  At this time with diagnostic workup within normal limits, vital signs normal and patient having nonreproducible pain, I feel that cholecystitis, active  biliary colic, pneumonia, pneumothorax or other emergent conditions are ruled out.  No hypoxia or tachycardia or dyspnea to suggest PE.  I reviewed the plan with the patient and his wife of symptomatic treatment with continued use of Tylenol  for pain control as needed.  They will follow-up in about a week with PCP to determine if any further diagnostic evaluation is indicated if symptoms are persisting.  We reviewed return precautions for any development of fever, loss of appetite, vomiting, shortness of breath or other concerning changes.  They voiced understanding.        Final Clinical Impression(s) / ED Diagnoses Final diagnoses:  Right flank pain    Rx / DC Orders ED Discharge Orders     None         Wynetta Heckle, MD 01/25/24 1039

## 2024-01-25 NOTE — Discharge Instructions (Signed)
 1.  At this time there is no clear reason for your pain.  Your CT scan does not show any kidney stones, pneumonia or liver or gallbladder problems.  You may need further diagnostic testing if your pain persists or new symptoms develop.  Schedule a follow-up appointment with your doctor in about 7 to 10 days. 2.  Return to the emergency department if you develop a fever, vomiting, worsening or persisting pain or other concerning changes.

## 2024-01-25 NOTE — ED Triage Notes (Signed)
 Patient here from home reporting right side flank pain that started 1 week ago. Denies urinary issues. Denis n/v.

## 2024-01-31 ENCOUNTER — Other Ambulatory Visit: Payer: Self-pay

## 2024-01-31 ENCOUNTER — Emergency Department (HOSPITAL_COMMUNITY)
Admission: EM | Admit: 2024-01-31 | Discharge: 2024-01-31 | Disposition: A | Discharge status: Home / Self Care | Attending: Emergency Medicine | Admitting: Emergency Medicine

## 2024-01-31 DIAGNOSIS — Z87442 Personal history of urinary calculi: Secondary | ICD-10-CM | POA: Diagnosis not present

## 2024-01-31 DIAGNOSIS — M62830 Muscle spasm of back: Secondary | ICD-10-CM | POA: Insufficient documentation

## 2024-01-31 DIAGNOSIS — M545 Low back pain, unspecified: Secondary | ICD-10-CM | POA: Diagnosis present

## 2024-01-31 DIAGNOSIS — M549 Dorsalgia, unspecified: Secondary | ICD-10-CM

## 2024-01-31 MED ORDER — NAPROXEN 375 MG PO TABS: 375.0000 mg | ORAL_TABLET | Freq: Two times a day (BID) | ORAL | 0 refills | Status: AC

## 2024-01-31 MED ORDER — CYCLOBENZAPRINE HCL 7.5 MG PO TABS
7.5000 mg | ORAL_TABLET | Freq: Two times a day (BID) | ORAL | 0 refills | Status: AC | PRN
Start: 2024-01-31 — End: ?

## 2024-01-31 NOTE — Discharge Instructions (Addendum)
 You have been seen and discharged from the emergency department.  I believe you are suffering from musculoskeletal pain and spasm.  Take the muscle relaxer as directed.  Do not mix this medication with alcohol or other sedating medications. Do not drive or do heavy physical activity until you know how this medication affects you.  It may cause drowsiness.  Take the anti-inflammatory pain medicine as directed and with drink/food. Follow-up with your primary provider for further evaluation and further care. Take home medications as prescribed. If you have any worsening symptoms or further concerns for your health please return to an emergency department for further evaluation.

## 2024-01-31 NOTE — ED Provider Notes (Signed)
 [place] EMERGENCY DEPARTMENT AT [facility] Provider Note   CSN: [id number] Arrival date & time: 01/31/24  1230     History  Chief Complaint  Patient presents with   Back Pain    Pt was here approx 1 week ago for same back pain (Dr. Wynona Hedger at [place]) has seen him as well. Back pain constant and now on both sides of back. While here last week identified kidney stone on the left side, but since 1 week ago, pain progressed to both sides. Denies urinary symptoms at this time.     [name] is a 83 y.o. male.  HPI   83 year old male presents emergency department with mid back pain that is radiating to his lower back, into his right buttocks and down his right leg.  Was seen approximately week ago for left-sided back pain.  Had evaluation, was diagnosed with a left-sided kidney stone.  Not prescribed medications.  Had a follow-up with his primary doctor where he was somewhat improving.  However over the last 2 to 3 days the pain has worsened and is now involving the right side of the back, right buttocks and radiation down the right leg.  He denies any numbness in the legs, there is been no discoloration.  He ambulates with a cane without any significant weakness.  There is been no injury or trauma.  He denies any fever, difficulty with urination/bowel movements.  He has no symptoms of regards to kidney stone including hematuria, dysuria, frequency.  Home Medications Prior to Admission medications   Medication Sig Start Date End Date Taking? Authorizing Provider  acetaminophen  (TYLENOL ) 325 MG tablet Take 325 mg by mouth every 4 (four) hours as needed for mild pain or moderate pain.    [provider]  Blood Glucose Monitoring Suppl (ACCU-CHEK GUIDE ME) w/Device KIT See admin instructions. 06/01/20   [provider]  donepezil (ARICEPT) 10 MG tablet Take 10 mg by mouth at bedtime. 03/06/22   [provider]  glipiZIDE 2.5 MG TABS Take 1 tablet  by mouth daily. 11/05/23   [provider]  Lancets Misc. (ACCU-CHEK FASTCLIX LANCET) KIT See admin instructions. 03/21/22   [provider]  LANTUS SOLOSTAR 100 UNIT/ML Solostar Pen Inject 15 Units into the skin daily.    [provider]  metFORMIN  (GLUCOPHAGE -XR) 500 MG 24 hr tablet Take 500 mg by mouth daily with breakfast.    [provider]  tamsulosin (FLOMAX) 0.4 MG CAPS capsule Take 0.4 mg by mouth.    [provider]      Allergies    Aspirin, Atorvastatin , Insulin  glargine, Metformin , and Amoxicillin    Review of Systems   Review of Systems  Constitutional:  Negative for fever.  Respiratory:  Negative for shortness of breath.   Cardiovascular:  Negative for chest pain.  Gastrointestinal:  Negative for abdominal pain, diarrhea and vomiting.  Genitourinary:  Negative for difficulty urinating, dysuria and hematuria.  Musculoskeletal:  Positive for back pain.  Skin:  Negative for color change, pallor and rash.  Neurological:  Negative for weakness, numbness and headaches.    Physical Exam Updated Vital Signs BP (!) 140/84 (BP Location: Right Arm)   Pulse 81   Temp 98.2 F (36.8 C) (Oral)   Resp 18   Ht 5\' 7"  (1.702 m)   Wt 74.8 kg   SpO2 100%   BMI 25.84 kg/m  Physical Exam Vitals and nursing note reviewed.  Constitutional:  Appearance: Normal appearance.  HENT:     Head: Normocephalic.     Mouth/Throat:     Mouth: Mucous membranes are moist.  Cardiovascular:     Rate and Rhythm: Normal rate.  Pulmonary:     Effort: Pulmonary effort is normal. No respiratory distress.  Abdominal:     Palpations: Abdomen is soft.     Tenderness: There is no abdominal tenderness.  Musculoskeletal:     Comments: No midline spinal tenderness including the cervical, thoracic and lumbar spine.  He has paraspinal musculature tenderness in the mid back that is reproducible on the left and right as well as some tenderness to palpation  around the SI/buttocks area of the right.  Negative straight leg test.  Sensory and strength is equal and intact in the lower extremities.  Equal pulses.  Skin:    General: Skin is warm.  Neurological:     Mental Status: He is alert and oriented to person, place, and time. Mental status is at baseline.  Psychiatric:        Mood and Affect: Mood normal.     ED Results / Procedures / Treatments   Labs (all labs ordered are listed, but only abnormal results are displayed) Labs Reviewed - No data to display  EKG None  Radiology No results found.  Procedures Procedures    Medications Ordered in ED Medications - No data to display  ED Course/ Medical Decision Making/ A&P                                 Medical Decision Making Risk Prescription drug management.   83 year old male presents emergency department ongoing lower back pain.  Was seen about a week ago, CT imaging at that time showed a nonobstructive calculus in the left lower pole, he was not having any genitourinary symptoms, urinalysis was unremarkable, was discharged for outpatient follow-up.  Of note the scan did not show any aneurysm or acute finding from a musculoskeletal standpoint.  He presents today with ongoing left lower back pain that is radiating to the right side and into the right buttocks.  Vitals are normal and stable.  He has no genitourinary symptoms.  Doubt this pain is related to the nephrolithiasis giving his radiation to the right and right buttocks.  He has reproducible tenderness in the left and right mid paraspinal musculature extending into the right buttocks.  The lower extremities are neurovascularly intact.  I believe he is suffering from mid/lower back muscular pain/spasm that is reproducible on exam.  Low suspicion for intra-abdominal pathology at this time.  Will treat symptomatically and plan for outpatient follow-up.  Patient at this time appears safe and stable for discharge and close  outpatient follow up. Discharge plan and strict return to ED precautions discussed, patient verbalizes understanding and agreement.        Final Clinical Impression(s) / ED Diagnoses Final diagnoses:  None    Rx / DC Orders ED Discharge Orders     None         Flonnie Humphrey, DO 01/31/24 1727

## 2024-02-08 ENCOUNTER — Encounter (HOSPITAL_COMMUNITY): Payer: Self-pay

## 2024-02-08 ENCOUNTER — Emergency Department (HOSPITAL_COMMUNITY)

## 2024-02-08 ENCOUNTER — Emergency Department (HOSPITAL_COMMUNITY)
Admission: EM | Admit: 2024-02-08 | Discharge: 2024-02-08 | Disposition: A | Discharge status: Home / Self Care | Attending: Emergency Medicine | Admitting: Emergency Medicine

## 2024-02-08 ENCOUNTER — Other Ambulatory Visit: Payer: Self-pay

## 2024-02-08 DIAGNOSIS — R109 Unspecified abdominal pain: Secondary | ICD-10-CM | POA: Diagnosis present

## 2024-02-08 DIAGNOSIS — N200 Calculus of kidney: Secondary | ICD-10-CM | POA: Insufficient documentation

## 2024-02-08 DIAGNOSIS — M545 Low back pain, unspecified: Secondary | ICD-10-CM | POA: Diagnosis not present

## 2024-02-08 DIAGNOSIS — Z794 Long term (current) use of insulin: Secondary | ICD-10-CM | POA: Diagnosis not present

## 2024-02-08 DIAGNOSIS — I10 Essential (primary) hypertension: Secondary | ICD-10-CM | POA: Insufficient documentation

## 2024-02-08 DIAGNOSIS — E119 Type 2 diabetes mellitus without complications: Secondary | ICD-10-CM | POA: Diagnosis not present

## 2024-02-08 LAB — COMPREHENSIVE METABOLIC PANEL WITH GFR
ALT: 11 U/L (ref 0–44)
AST: 17 U/L (ref 15–41)
Albumin: 3.6 g/dL (ref 3.5–5.0)
Alkaline Phosphatase: 56 U/L (ref 38–126)
Anion gap: 10 (ref 5–15)
BUN: 15 mg/dL (ref 8–23)
CO2: 23 mmol/L (ref 22–32)
Calcium: 8.9 mg/dL (ref 8.9–10.3)
Chloride: 101 mmol/L (ref 98–111)
Creatinine, Ser: 0.87 mg/dL (ref 0.61–1.24)
GFR, Estimated: >60 mL/min (ref >60)
Glucose, Bld: 202 mg/dL — ABNORMAL HIGH (ref 70–99)
Potassium: 3.7 mmol/L (ref 3.5–5.1)
Sodium: 134 mmol/L — ABNORMAL LOW (ref 135–145)
Total Bilirubin: 0.4 mg/dL (ref 0.0–1.2)
Total Protein: 7.2 g/dL (ref 6.5–8.1)

## 2024-02-08 LAB — URINALYSIS, ROUTINE W REFLEX MICROSCOPIC
Bilirubin Urine: NEGATIVE
Glucose, UA: >=500 mg/dL — AB
Hgb urine dipstick: NEGATIVE
Ketones, ur: NEGATIVE mg/dL
Leukocytes,Ua: NEGATIVE
Nitrite: NEGATIVE
Protein, ur: 30 mg/dL — AB
Specific Gravity, Urine: 1.023 (ref 1.005–1.030)
pH: 5 (ref 5.0–8.0)

## 2024-02-08 LAB — CBC WITH DIFFERENTIAL/PLATELET
Abs Immature Granulocytes: 0.03 10*3/uL (ref 0.00–0.07)
Basophils Absolute: 0 10*3/uL (ref 0.0–0.1)
Basophils Relative: 0 %
Eosinophils Absolute: 0.1 10*3/uL (ref 0.0–0.5)
Eosinophils Relative: 1 %
HCT: 33.3 % — ABNORMAL LOW (ref 39.0–52.0)
Hemoglobin: 11 g/dL — ABNORMAL LOW (ref 13.0–17.0)
Immature Granulocytes: 1 %
Lymphocytes Relative: 36 %
Lymphs Abs: 1.3 10*3/uL (ref 0.7–4.0)
MCH: 28.5 pg (ref 26.0–34.0)
MCHC: 33 g/dL (ref 30.0–36.0)
MCV: 86.3 fL (ref 80.0–100.0)
Monocytes Absolute: 0.5 10*3/uL (ref 0.1–1.0)
Monocytes Relative: 12 %
Neutro Abs: 1.8 10*3/uL (ref 1.7–7.7)
Neutrophils Relative %: 50 %
Platelets: 149 10*3/uL — ABNORMAL LOW (ref 150–400)
RBC: 3.86 MIL/uL — ABNORMAL LOW (ref 4.22–5.81)
RDW: 11.8 % (ref 11.5–15.5)
WBC: 3.7 10*3/uL — ABNORMAL LOW (ref 4.0–10.5)
nRBC: 0 % (ref 0.0–0.2)

## 2024-02-08 LAB — LIPASE, BLOOD: Lipase: 44 U/L (ref 11–51)

## 2024-02-08 MED ORDER — LIDOCAINE 4 % EX PTCH: 1.0000 | MEDICATED_PATCH | CUTANEOUS | 0 refills | Status: AC

## 2024-02-08 NOTE — ED Triage Notes (Signed)
 Patient has had right sided flank pain for over a week. Stated he has a kidney stone on the left side. Unable to get pain under control. Pain shoots down right leg. Denies nausea or vomiting.

## 2024-02-08 NOTE — Discharge Instructions (Signed)
 You were evaluated in the emergency room for flank pain.  Your lab work and imaging did not show any significant abnormality.  I suspect that your pain is musculoskeletal.  You have been provided a referral for orthopedics.  Please call make an appointment at your earliest convenience.

## 2024-02-08 NOTE — ED Provider Notes (Signed)
 [address] EMERGENCY DEPARTMENT AT [facility] Provider Note   CSN: [id number] Arrival date & time: 02/08/24  1149     Patient presents with: Flank Pain   [name] is a 83 y.o. male history of type I DM, hypertension, hyperlipidemia, TIA presents with complaints of right-sided back pain that radiates to his thigh.  No associated nausea or vomiting.  No urinary symptoms.  No testicular pain or swelling.  Symptoms have been ongoing for the past 3 weeks.  There is no injury or trauma.  No numbness or tingling.  No spinal surgical history.  Was evaluated here previously found to have a contralateral kidney stone.    Flank Pain   Past Medical History:  Diagnosis Date   Diabetes mellitus without complication (HCC)    Headache    Chronic   Hyperlipidemia    Hypertension    Syncope    TIA (transient ischemic attack)    History reviewed. No pertinent surgical history.     Prior to Admission medications   Medication Sig Start Date End Date Taking? Authorizing Provider  lidocaine  (HM LIDOCAINE  PATCH) 4 % Place 1 patch onto the skin daily. 02/08/24  Yes Donnajean Lynwood DEL, PA-C  acetaminophen  (TYLENOL ) 325 MG tablet Take 325 mg by mouth every 4 (four) hours as needed for mild pain or moderate pain.    [provider]  Blood Glucose Monitoring Suppl (ACCU-CHEK GUIDE ME) w/Device KIT See admin instructions. 06/01/20   [provider]  cyclobenzaprine  (FEXMID ) 7.5 MG tablet Take 1 tablet (7.5 mg total) by mouth 2 (two) times daily as needed for muscle spasms. 01/31/24   Horton, Roxie HERO, DO  donepezil (ARICEPT) 10 MG tablet Take 10 mg by mouth at bedtime. 03/06/22   [provider]  glipiZIDE 2.5 MG TABS Take 1 tablet by mouth daily. 11/05/23   [provider]  Lancets Misc. (ACCU-CHEK FASTCLIX LANCET) KIT See admin instructions. 03/21/22   [provider]  LANTUS SOLOSTAR 100 UNIT/ML Solostar Pen Inject 15 Units into the skin daily.     [provider]  metFORMIN  (GLUCOPHAGE -XR) 500 MG 24 hr tablet Take 500 mg by mouth daily with breakfast.    [provider]  naproxen  (NAPROSYN ) 375 MG tablet Take 1 tablet (375 mg total) by mouth 2 (two) times daily. 01/31/24   Horton, Roxie HERO, DO  tamsulosin (FLOMAX) 0.4 MG CAPS capsule Take 0.4 mg by mouth.    [provider]    Allergies: Aspirin, Atorvastatin , Insulin  glargine, Metformin , and Amoxicillin    Review of Systems  Genitourinary:  Positive for flank pain.    Updated Vital Signs BP (!) 141/77   Pulse 73   Temp 98.1 F (36.7 C) (Oral)   Resp 18   Ht 5' 7 (1.702 m)   Wt 77.1 kg   SpO2 98%   BMI 26.63 kg/m   Physical Exam Vitals and nursing note reviewed.  Constitutional:      General: He is not in acute distress.    Appearance: He is well-developed.  HENT:     Head: Normocephalic and atraumatic.   Eyes:     Conjunctiva/sclera: Conjunctivae normal.    Cardiovascular:     Rate and Rhythm: Normal rate and regular rhythm.     Heart sounds: No murmur heard. Pulmonary:     Effort: Pulmonary effort is normal. No respiratory distress.     Breath sounds: Normal breath sounds.  Abdominal:     Palpations:  Abdomen is soft.     Comments: Abdomen is soft, nondistended, right lower suprapubic tenderness without any obvious hernia appreciated, positive right CVAT   Musculoskeletal:        General: No swelling.     Cervical back: Neck supple.   Skin:    General: Skin is warm and dry.     Capillary Refill: Capillary refill takes less than 2 seconds.   Neurological:     Mental Status: He is alert.   Psychiatric:        Mood and Affect: Mood normal.     (all labs ordered are listed, but only abnormal results are displayed) Labs Reviewed  CBC WITH DIFFERENTIAL/PLATELET - Abnormal; Notable for the following components:      Result Value   WBC 3.7 (*)    RBC 3.86 (*)    Hemoglobin 11.0 (*)    HCT 33.3 (*)    Platelets 149 (*)     All other components within normal limits  COMPREHENSIVE METABOLIC PANEL WITH GFR - Abnormal; Notable for the following components:   Sodium 134 (*)    Glucose, Bld 202 (*)    All other components within normal limits  URINALYSIS, ROUTINE W REFLEX MICROSCOPIC - Abnormal; Notable for the following components:   Glucose, UA >=500 (*)    Protein, ur 30 (*)    Bacteria, UA RARE (*)    All other components within normal limits  LIPASE, BLOOD    EKG: None  Radiology: CT Renal Stone Study Result Date: 02/08/2024 CLINICAL DATA:  Flank pain. EXAM: CT ABDOMEN AND PELVIS WITHOUT CONTRAST TECHNIQUE: Multidetector CT imaging of the abdomen and pelvis was performed following the standard protocol without IV contrast. RADIATION DOSE REDUCTION: This exam was performed according to the departmental dose-optimization program which includes automated exposure control, adjustment of the mA and/or kV according to patient size and/or use of iterative reconstruction technique. COMPARISON:  01/25/2024. FINDINGS: Lower chest: No acute abnormality. No pleural or pericardial effusions. Hepatobiliary: No focal liver abnormality is seen. No gallstones, gallbladder wall thickening, or biliary dilatation. Pancreas: Unremarkable. No pancreatic ductal dilatation or surrounding inflammatory changes. Spleen: Normal in size without focal abnormality. Adrenals/Urinary Tract: No adrenal lesions. No hydronephrosis. Intrarenal 5 mm stone on the left. Unremarkable urinary bladder. Stomach/Bowel: Stomach is within normal limits. Appendix appears normal. No evidence of bowel wall thickening, distention, or inflammatory changes. Vascular/Lymphatic: No significant vascular findings are present. No enlarged abdominal or pelvic lymph nodes. Reproductive: Prostate is prominent and unchanged. Other: No abdominal wall hernia or abnormality. No abdominopelvic ascites. Musculoskeletal: No acute or significant osseous findings. IMPRESSION:  Left-sided nephrolithiasis. No obstructive uropathy. Electronically Signed   By: Fonda Field M.D.   On: 02/08/2024 13:13     Procedures   Medications Ordered in the ED - No data to display                                  Medical Decision Making  This patient presents to the ED with chief complaint(s) of flank pain.  The complaint involves an extensive differential diagnosis and also carries with it a high risk of complications and morbidity.   Pertinent past medical history as listed in HPI  The differential diagnosis includes  Nephrolithiasis, cystitis, pyelonephritis, aortic dissection, musculoskeletal Additional history obtained: Additional history obtained from spouse Records reviewed Care Everywhere/External Records  Assessment and management:   Hemodynamically stable, nontoxic-appearing patient presenting  with complaints of 3 weeks of flank pain.  No injury or trauma.  Not associate with nausea, vomiting or diarrhea.  No urinary symptoms.  No scrotal swelling or pain.  Was evaluated here previously for left-sided flank pain found to have a kidney stone at that time.  Was evaluated again for his now current symptoms on the right.  Was felt to be consistent with musculoskeletal etiology such as muscle spasm.  Was treated symptomatically with cyclobenzaprine  and naproxen .  Symptoms have persisted.  On exam patient has lower right suprapubic tenderness with no hernia appreciated, he does have lumbar right-sided paraspinal tenderness with right CVAT.  Will repeat labs and imaging today.  Lab work overall reassuring.  CT negative for any acute pathology.  Overall most consistent with musculoskeletal etiology.  Will refer to  orthopedics.  Independent ECG interpretation:  none  Independent labs interpretation:  The following labs were independently interpreted:  CBC and cmp without significant abnormality, lipase within normal limits, UA unremarkable  Independent visualization  and interpretation of imaging: I independently visualized the following imaging with scope of interpretation limited to determining acute life threatening conditions related to emergency care:  CT renal study with left-sided nephrolithiasis   Consultations obtained:   None   Disposition:   Patient will be discharged home. The patient has been appropriately medically screened and/or stabilized in the ED. I have low suspicion for any other emergent medical condition which would require further screening, evaluation or treatment in the ED or require inpatient management. At time of discharge the patient is hemodynamically stable and in no acute distress. I have discussed work-up results and diagnosis with patient and answered all questions. Patient is agreeable with discharge plan. We discussed strict return precautions for returning to the emergency department and they verbalized understanding.     Social Determinants of Health:   none  This note was dictated with voice recognition software.  Despite best efforts at proofreading, errors may have occurred which can change the documentation meaning.       Final diagnoses:  Flank pain    ED Discharge Orders          Ordered    lidocaine  (HM LIDOCAINE  PATCH) 4 %  Every 24 hours        02/08/24 1447               Donnajean Lynwood VEAR DEVONNA 02/08/24 1448    Laurice Maude BROCKS, MD 02/12/24 1544

## 2024-03-20 ENCOUNTER — Emergency Department (HOSPITAL_COMMUNITY): Admission: EM | Admit: 2024-03-20 | Discharge: 2024-03-20 | Disposition: A | Discharge status: Home / Self Care

## 2024-03-20 ENCOUNTER — Other Ambulatory Visit: Payer: Self-pay

## 2024-03-20 ENCOUNTER — Emergency Department (HOSPITAL_COMMUNITY)

## 2024-03-20 DIAGNOSIS — M545 Low back pain, unspecified: Secondary | ICD-10-CM

## 2024-03-20 DIAGNOSIS — R109 Unspecified abdominal pain: Secondary | ICD-10-CM | POA: Diagnosis not present

## 2024-03-20 DIAGNOSIS — Z79899 Other long term (current) drug therapy: Secondary | ICD-10-CM | POA: Insufficient documentation

## 2024-03-20 DIAGNOSIS — Z794 Long term (current) use of insulin: Secondary | ICD-10-CM | POA: Insufficient documentation

## 2024-03-20 DIAGNOSIS — F0390 Unspecified dementia without behavioral disturbance: Secondary | ICD-10-CM | POA: Diagnosis not present

## 2024-03-20 DIAGNOSIS — I10 Essential (primary) hypertension: Secondary | ICD-10-CM | POA: Diagnosis not present

## 2024-03-20 DIAGNOSIS — M5416 Radiculopathy, lumbar region: Secondary | ICD-10-CM | POA: Diagnosis not present

## 2024-03-20 DIAGNOSIS — E119 Type 2 diabetes mellitus without complications: Secondary | ICD-10-CM | POA: Diagnosis not present

## 2024-03-20 DIAGNOSIS — Z7984 Long term (current) use of oral hypoglycemic drugs: Secondary | ICD-10-CM | POA: Diagnosis not present

## 2024-03-20 DIAGNOSIS — M549 Dorsalgia, unspecified: Secondary | ICD-10-CM | POA: Diagnosis present

## 2024-03-20 LAB — URINALYSIS, W/ REFLEX TO CULTURE (INFECTION SUSPECTED)
Bilirubin Urine: NEGATIVE
Glucose, UA: NEGATIVE mg/dL
Hgb urine dipstick: NEGATIVE
Ketones, ur: NEGATIVE mg/dL
Leukocytes,Ua: NEGATIVE
Nitrite: NEGATIVE
Protein, ur: NEGATIVE mg/dL
Specific Gravity, Urine: 1.014 (ref 1.005–1.030)
pH: 6 (ref 5.0–8.0)

## 2024-03-20 LAB — BASIC METABOLIC PANEL WITH GFR
Anion gap: 8 (ref 5–15)
BUN: 13 mg/dL (ref 8–23)
CO2: 24 mmol/L (ref 22–32)
Calcium: 8.8 mg/dL — ABNORMAL LOW (ref 8.9–10.3)
Chloride: 106 mmol/L (ref 98–111)
Creatinine, Ser: 0.98 mg/dL (ref 0.61–1.24)
GFR, Estimated: >60 mL/min (ref >60)
Glucose, Bld: 95 mg/dL (ref 70–99)
Potassium: 3.7 mmol/L (ref 3.5–5.1)
Sodium: 138 mmol/L (ref 135–145)

## 2024-03-20 LAB — CBC
HCT: 33.1 % — ABNORMAL LOW (ref 39.0–52.0)
Hemoglobin: 10.4 g/dL — ABNORMAL LOW (ref 13.0–17.0)
MCH: 27.4 pg (ref 26.0–34.0)
MCHC: 31.4 g/dL (ref 30.0–36.0)
MCV: 87.1 fL (ref 80.0–100.0)
Platelets: 143 K/uL — ABNORMAL LOW (ref 150–400)
RBC: 3.8 MIL/uL — ABNORMAL LOW (ref 4.22–5.81)
RDW: 12.3 % (ref 11.5–15.5)
WBC: 3.3 K/uL — ABNORMAL LOW (ref 4.0–10.5)
nRBC: 0 % (ref 0.0–0.2)

## 2024-03-20 LAB — HEPATIC FUNCTION PANEL
ALT: 10 U/L (ref 0–44)
AST: 19 U/L (ref 15–41)
Albumin: 3.2 g/dL — ABNORMAL LOW (ref 3.5–5.0)
Alkaline Phosphatase: 54 U/L (ref 38–126)
Bilirubin, Direct: <0.1 mg/dL (ref 0.0–0.2)
Total Bilirubin: 0.5 mg/dL (ref 0.0–1.2)
Total Protein: 6.7 g/dL (ref 6.5–8.1)

## 2024-03-20 LAB — CBG MONITORING, ED: Glucose-Capillary: 73 mg/dL (ref 70–99)

## 2024-03-20 NOTE — Discharge Instructions (Addendum)
 As we discussed, your workup is overall reassuring for acute findings.  You still do have the kidney stone on the left side but it is still within your kidney and should not be causing your symptoms.  Your CT scan did show that you have some degenerative changes in your lumbar spine which are likely causing your pain.  I recommend you call your PCP to schedule a close follow-up appointment for continued evaluation and management of this.  Please take your home pain medication, rest, use a heating pad, and do not lift heavier than 10 pounds while you are having pain.  Your PCP may need to send you for physical therapy or occupational therapy to help manage your symptoms at home.  Return if development of any new or worsening symptoms.

## 2024-03-20 NOTE — ED Provider Notes (Signed)
 [place] EMERGENCY DEPARTMENT AT [facility] Provider Note   CSN: [id number] Arrival date & time: 03/20/24  1142     Patient presents with: Flank Pain   [name] is a 83 y.o. male.  {Add pertinent medical, surgical, social history, OB history to HPI:32947} History of diabetes, hypertension, hyperlipidemia, dementia, CVA presents today with complaints of back pain.  Given his dementia, majority of history is provided by his wife who is present at bedside.  He reports that he has a longstanding history of right-sided back pain, however today started having some pain on the left side as well.  He has been seen and evaluated for his right-sided back pain before and had a CT scan that showed a kidney stone in his left kidney.  He is concerned that now with the left-sided pain that this is due to his kidney stone.  He denies any abdominal pain, no nausea vomiting or diarrhea.  No urinary symptoms.  No history of back surgeries.  No recent falls or trauma.  No loss of bowel or bladder function or saddle paresthesias.  He is able to walk with a cane which is his baseline.  Denies any numbness/tingling in his extremities or sharp shooting pain into his legs.   The history is provided by the patient. No language interpreter was used.  Flank Pain       Prior to Admission medications   Medication Sig Start Date End Date Taking? Authorizing Provider  acetaminophen  (TYLENOL ) 325 MG tablet Take 325 mg by mouth every 4 (four) hours as needed for mild pain or moderate pain.    [provider]  Blood Glucose Monitoring Suppl (ACCU-CHEK GUIDE ME) w/Device KIT See admin instructions. 06/01/20   [provider]  cyclobenzaprine  (FEXMID ) 7.5 MG tablet Take 1 tablet (7.5 mg total) by mouth 2 (two) times daily as needed for muscle spasms. 01/31/24   Horton, Roxie HERO, DO  donepezil (ARICEPT) 10 MG tablet Take 10 mg by mouth at bedtime. 03/06/22   [provider]   glipiZIDE 2.5 MG TABS Take 1 tablet by mouth daily. 11/05/23   [provider]  Lancets Misc. (ACCU-CHEK FASTCLIX LANCET) KIT See admin instructions. 03/21/22   [provider]  LANTUS SOLOSTAR 100 UNIT/ML Solostar Pen Inject 15 Units into the skin daily.    [provider]  lidocaine  (HM LIDOCAINE  PATCH) 4 % Place 1 patch onto the skin daily. 02/08/24   Donnajean Lynwood DEL, PA-C  metFORMIN  (GLUCOPHAGE -XR) 500 MG 24 hr tablet Take 500 mg by mouth daily with breakfast.    [provider]  naproxen  (NAPROSYN ) 375 MG tablet Take 1 tablet (375 mg total) by mouth 2 (two) times daily. 01/31/24   Horton, Roxie HERO, DO  tamsulosin (FLOMAX) 0.4 MG CAPS capsule Take 0.4 mg by mouth.    [provider]    Allergies: Aspirin, Atorvastatin , Insulin  glargine, Metformin , and Amoxicillin    Review of Systems  Genitourinary:  Positive for flank pain.  Musculoskeletal:  Positive for back pain.  All other systems reviewed and are negative.   Updated Vital Signs BP (!) 118/59   Pulse 71   Temp 97.7 F (36.5 C)   Resp 18   SpO2 98%   Physical Exam Vitals and nursing note reviewed.  Constitutional:      General: He is not in acute distress.    Appearance: Normal appearance. He is normal weight. He is not ill-appearing, toxic-appearing or diaphoretic.  HENT:  Head: Normocephalic and atraumatic.  Cardiovascular:     Rate and Rhythm: Normal rate.  Pulmonary:     Effort: Pulmonary effort is normal. No respiratory distress.  Abdominal:     General: Abdomen is flat.     Palpations: Abdomen is soft.     Tenderness: There is no abdominal tenderness. There is no right CVA tenderness or left CVA tenderness.  Musculoskeletal:        General: Normal range of motion.     Cervical back: Normal range of motion.     Comments: No tenderness to palpation over the midline cervical, thoracic, or lumbar spine.  No step-offs, lesions, deformity, or overlying skin changes.   Mild paraspinous muscular tightness and tenderness palpable bilaterally in the lumbar spine.  Negative SLR bilaterally.  Sensation intact bilaterally.  Able to raise his legs off the bed without assistance.  DP PT pulses are intact bilaterally  Skin:    General: Skin is warm and dry.  Neurological:     General: No focal deficit present.     Mental Status: He is alert.  Psychiatric:        Mood and Affect: Mood normal.        Behavior: Behavior normal.     (all labs ordered are listed, but only abnormal results are displayed) Labs Reviewed  BASIC METABOLIC PANEL WITH GFR - Abnormal; Notable for the following components:      Result Value   Calcium  8.8 (*)    All other components within normal limits  CBC - Abnormal; Notable for the following components:   WBC 3.3 (*)    RBC 3.80 (*)    Hemoglobin 10.4 (*)    HCT 33.1 (*)    Platelets 143 (*)    All other components within normal limits  URINALYSIS, W/ REFLEX TO CULTURE (INFECTION SUSPECTED) - Abnormal; Notable for the following components:   Bacteria, UA RARE (*)    All other components within normal limits  HEPATIC FUNCTION PANEL - Abnormal; Notable for the following components:   Albumin 3.2 (*)    All other components within normal limits  CBG MONITORING, ED    EKG: None  Radiology: CT Renal Stone Study Result Date: 03/20/2024 CLINICAL DATA:  Right flank pain starting this morning. History of renal calculi. EXAM: CT ABDOMEN AND PELVIS WITHOUT CONTRAST TECHNIQUE: Multidetector CT imaging of the abdomen and pelvis was performed following the standard protocol without IV contrast. RADIATION DOSE REDUCTION: This exam was performed according to the departmental dose-optimization program which includes automated exposure control, adjustment of the mA and/or kV according to patient size and/or use of iterative reconstruction technique. COMPARISON:  02/08/2024 FINDINGS: Lower chest: Left anterior descending coronary atherosclerosis.  Bilateral gynecomastia. Loop recorder noted. Faint reticular interstitial accentuation in the lung bases without significant basilar pulmonary nodule. Hepatobiliary: Unremarkable Pancreas: Unremarkable Spleen: Unremarkable Adrenals/Urinary Tract: Both adrenal glands appear normal. No hydronephrosis or hydroureter. Nonobstructive 7 mm left kidney lower pole calculus. Stomach/Bowel: Visualized segment of the appendix appears normal. Fatty deposition in the wall the colon is typically considered a normal variant finding although can have a weak association with inflammatory bowel disease. No inflammatory findings affecting the terminal ileum. There is mild diffuse wall thickening in the rectum likely attributable to nondistention. Vascular/Lymphatic: Unremarkable Reproductive: Prostatomegaly. Other: No supplemental non-categorized findings. Musculoskeletal: Mild degenerative hip arthropathy bilaterally. Lower thoracic spondylosis. Lower lumbar spondylosis and degenerative disc disease causing foraminal impingement on the right at L4-5 and L5-S1, and likely on  the left at L5-S1. IMPRESSION: 1. Nonobstructive 7 mm left kidney lower pole calculus. 2. Prostatomegaly. 3. Lower lumbar foraminal impingement on the right at L4-5 and L5-S1, and likely on the left at L5-S1. 4. Left anterior descending coronary atherosclerosis. 5. Bilateral gynecomastia. 6. Mild diffuse wall thickening in the rectum likely attributable to nondistention. 7. Faint reticular interstitial accentuation in the lung bases without significant basilar pulmonary nodule. Electronically Signed   By: Ryan Salvage M.D.   On: 03/20/2024 15:11    {Document cardiac monitor, telemetry assessment procedure when appropriate:32947} Procedures   Medications Ordered in the ED - No data to display    {Click here for ABCD2, HEART and other calculators REFRESH Note before signing:1}                              Medical Decision Making Amount and/or  Complexity of Data Reviewed Labs: ordered. Radiology: ordered.   This patient is a 83 y.o. male who presents to the ED for concern of back pain, this involves an extensive number of treatment options, and is a complaint that carries with it a high risk of complications and morbidity. The emergent differential diagnosis prior to evaluation includes, but is not limited to,  Fracture (acute/chronic), muscle strain, cauda equina, spinal stenosis, DDD, metastatic cancer, vertebral osteomyelitis, kidney stone, pyelonephritis, AAA, pancreatitis, bowel obstruction, meningitis.  This is not an exhaustive differential.   Past Medical History / Co-morbidities / Social History:  has a past medical history of Diabetes mellitus without complication (HCC), Headache, Hyperlipidemia, Hypertension, Syncope, and TIA (transient ischemic attack).  Additional history: Chart reviewed. Pertinent results include: Patient has had 3 previous visits for pain/right flank pain.  Has had 2 previous CT renal studies that showed a stable nephrolithiasis on the left side.  Workup otherwise benign, pain attributed to MSK back pain, discharged with outpatient follow-up.  Physical Exam: Physical exam performed. The pertinent findings include:   No tenderness to palpation over the midline cervical, thoracic, or lumbar spine.  No step-offs, lesions, deformity, or overlying skin changes.  Mild paraspinous muscular tightness and tenderness palpable bilaterally in the lumbar spine.  Negative SLR bilaterally.  Sensation intact bilaterally.  Able to raise his legs off the bed without assistance.  DP PT pulses are intact bilaterally  Lab Tests: I ordered, and personally interpreted labs.  The pertinent results include:  WBC 3.3 consistent with previous, hgb 10.4 consistent with previous, UA noninfectious   Imaging Studies: I ordered imaging studies including CT renal. I independently visualized and interpreted imaging which showed    1. Nonobstructive 7 mm left kidney lower pole calculus. 2. Prostatomegaly. 3. Lower lumbar foraminal impingement on the right at L4-5 and L5-S1, and likely on the left at L5-S1. 4. Left anterior descending coronary atherosclerosis. 5. Bilateral gynecomastia. 6. Mild diffuse wall thickening in the rectum likely attributable to nondistention. 7. Faint reticular interstitial accentuation in the lung bases without significant basilar pulmonary nodule.  I agree with the radiologist interpretation.   Disposition: After consideration of the diagnostic results and the patients response to treatment, I feel that emergency department workup does not suggest an emergent condition requiring admission or immediate intervention beyond what has been performed at this time. The plan is: discharge with close outpatient follow-up and return precautions. The patient is safe for discharge and has been instructed to return immediately for worsening symptoms, change in symptoms or any other concerns.  I discussed  this case with my attending physician Dr. PIERRETTE who cosigned this note including patient's presenting symptoms, physical exam, and planned diagnostics and interventions. Attending physician stated agreement with plan or made changes to plan which were implemented.     {Document critical care time when appropriate  Document review of labs and clinical decision tools ie [id number], etc  Document your independent review of radiology images and any outside records  Document your discussion with family members, caretakers and with consultants  Document social determinants of health affecting pt's care  Document your decision making why or why not admission, treatments were needed:32947:::1}   Final diagnoses:  Chronic bilateral low back pain without sciatica  Lumbar nerve root impingement    ED Discharge Orders     None

## 2024-03-20 NOTE — ED Notes (Signed)
 Lab called to add Hepatic function panel.

## 2024-03-20 NOTE — ED Triage Notes (Signed)
 Pt has c/o right side flank pain that started this AM, has hx of kidney stones.

## 2024-04-26 ENCOUNTER — Encounter (HOSPITAL_COMMUNITY): Payer: Self-pay

## 2024-04-26 ENCOUNTER — Other Ambulatory Visit: Payer: Self-pay

## 2024-04-26 ENCOUNTER — Emergency Department (HOSPITAL_COMMUNITY)
Admission: EM | Admit: 2024-04-26 | Discharge: 2024-04-26 | Disposition: A | Discharge status: Home / Self Care | Attending: Emergency Medicine | Admitting: Emergency Medicine

## 2024-04-26 DIAGNOSIS — M549 Dorsalgia, unspecified: Secondary | ICD-10-CM | POA: Diagnosis present

## 2024-04-26 DIAGNOSIS — Z7984 Long term (current) use of oral hypoglycemic drugs: Secondary | ICD-10-CM | POA: Diagnosis not present

## 2024-04-26 DIAGNOSIS — I10 Essential (primary) hypertension: Secondary | ICD-10-CM | POA: Diagnosis not present

## 2024-04-26 DIAGNOSIS — E119 Type 2 diabetes mellitus without complications: Secondary | ICD-10-CM | POA: Insufficient documentation

## 2024-04-26 DIAGNOSIS — X501XXA Overexertion from prolonged static or awkward postures, initial encounter: Secondary | ICD-10-CM | POA: Diagnosis not present

## 2024-04-26 DIAGNOSIS — G8929 Other chronic pain: Secondary | ICD-10-CM | POA: Diagnosis not present

## 2024-04-26 LAB — URINALYSIS, ROUTINE W REFLEX MICROSCOPIC
Bacteria, UA: NONE SEEN
Bilirubin Urine: NEGATIVE
Glucose, UA: >=500 mg/dL — AB
Hgb urine dipstick: NEGATIVE
Ketones, ur: NEGATIVE mg/dL
Leukocytes,Ua: NEGATIVE
Nitrite: NEGATIVE
Protein, ur: 30 mg/dL — AB
Specific Gravity, Urine: 1.036 — ABNORMAL HIGH (ref 1.005–1.030)
pH: 5 (ref 5.0–8.0)

## 2024-04-26 LAB — COMPREHENSIVE METABOLIC PANEL WITH GFR
ALT: 13 U/L (ref 0–44)
AST: 18 U/L (ref 15–41)
Albumin: 4.4 g/dL (ref 3.5–5.0)
Alkaline Phosphatase: 74 U/L (ref 38–126)
Anion gap: 13 (ref 5–15)
BUN: 14 mg/dL (ref 8–23)
CO2: 23 mmol/L (ref 22–32)
Calcium: 9.7 mg/dL (ref 8.9–10.3)
Chloride: 100 mmol/L (ref 98–111)
Creatinine, Ser: 1.02 mg/dL (ref 0.61–1.24)
GFR, Estimated: >60 mL/min (ref >60)
Glucose, Bld: 340 mg/dL — ABNORMAL HIGH (ref 70–99)
Potassium: 4.1 mmol/L (ref 3.5–5.1)
Sodium: 136 mmol/L (ref 135–145)
Total Bilirubin: 0.4 mg/dL (ref 0.0–1.2)
Total Protein: 7.6 g/dL (ref 6.5–8.1)

## 2024-04-26 LAB — CBC WITH DIFFERENTIAL/PLATELET
Abs Immature Granulocytes: 0.07 K/uL (ref 0.00–0.07)
Basophils Absolute: 0 K/uL (ref 0.0–0.1)
Basophils Relative: 0 %
Eosinophils Absolute: 0 K/uL (ref 0.0–0.5)
Eosinophils Relative: 0 %
HCT: 37.7 % — ABNORMAL LOW (ref 39.0–52.0)
Hemoglobin: 12 g/dL — ABNORMAL LOW (ref 13.0–17.0)
Immature Granulocytes: 1 %
Lymphocytes Relative: 14 %
Lymphs Abs: 1 K/uL (ref 0.7–4.0)
MCH: 27.8 pg (ref 26.0–34.0)
MCHC: 31.8 g/dL (ref 30.0–36.0)
MCV: 87.5 fL (ref 80.0–100.0)
Monocytes Absolute: 0.3 K/uL (ref 0.1–1.0)
Monocytes Relative: 5 %
Neutro Abs: 5.7 K/uL (ref 1.7–7.7)
Neutrophils Relative %: 80 %
Platelets: 150 K/uL (ref 150–400)
RBC: 4.31 MIL/uL (ref 4.22–5.81)
RDW: 12.7 % (ref 11.5–15.5)
WBC: 7.1 K/uL (ref 4.0–10.5)
nRBC: 0 % (ref 0.0–0.2)

## 2024-04-26 MED ORDER — SODIUM CHLORIDE 0.9 % IV BOLUS: 1000.0000 mL | Freq: Once | INTRAVENOUS | Status: DC

## 2024-04-26 MED ORDER — CYCLOBENZAPRINE HCL 10 MG PO TABS: 10.0000 mg | ORAL_TABLET | Freq: Two times a day (BID) | ORAL | 0 refills | Status: AC | PRN

## 2024-04-26 MED ORDER — CYCLOBENZAPRINE HCL 10 MG PO TABS
5.0000 mg | ORAL_TABLET | Freq: Once | ORAL | Status: AC
  Administered 2024-04-26: 5 mg via ORAL
  Filled 2024-04-26: qty 1

## 2024-04-26 MED ORDER — MELOXICAM 7.5 MG PO TABS: 7.5000 mg | ORAL_TABLET | Freq: Every day | ORAL | 0 refills | Status: AC

## 2024-04-26 MED ORDER — KETOROLAC TROMETHAMINE 15 MG/ML IJ SOLN
15.0000 mg | Freq: Once | INTRAMUSCULAR | Status: AC
  Administered 2024-04-26: 15 mg via INTRAMUSCULAR
  Filled 2024-04-26: qty 1

## 2024-04-26 MED ORDER — LIDOCAINE 5 % EX PTCH
2.0000 | MEDICATED_PATCH | CUTANEOUS | Status: DC
  Administered 2024-04-26: 2 via TRANSDERMAL
  Filled 2024-04-26: qty 2

## 2024-04-26 NOTE — ED Provider Notes (Signed)
 [place] EMERGENCY DEPARTMENT AT [facility] Provider Note   CSN: [id number] Arrival date & time: 04/26/24  1509     Patient presents with: Back Pain   [name] is a 83 y.o. male with past medical history of non-insulin -dependent T2DM, HTN, HLD, TIA presents emerged department for evaluation of constant right flank pain over the past 2 hours.  Pain worsens with bending, twisting, palpation.  Used Tylenol  6 Polite milligrams 2 hours ago without relief to pain.  Has a history of chronic right-sided back pain but reports this feels different.  Sought ED evaluation today as wife reports that we should just get it checked out to be safe.  Denies new trauma, urinary symptoms, weakness or numbness in the legs, fevers, history of IVDU, malignancy, saddle paresthesia, urinary incontinence.     Back Pain      Prior to Admission medications   Medication Sig Start Date End Date Taking? Authorizing Provider  cyclobenzaprine  (FLEXERIL ) 10 MG tablet Take 1 tablet (10 mg total) by mouth 2 (two) times daily as needed for muscle spasms. 04/26/24  Yes Minnie Tinnie BRAVO, PA  meloxicam  (MOBIC ) 7.5 MG tablet Take 1 tablet (7.5 mg total) by mouth daily. 04/26/24  Yes Minnie Tinnie BRAVO, PA  acetaminophen  (TYLENOL ) 325 MG tablet Take 325 mg by mouth every 4 (four) hours as needed for mild pain or moderate pain.    [provider]  Blood Glucose Monitoring Suppl (ACCU-CHEK GUIDE ME) w/Device KIT See admin instructions. 06/01/20   [provider]  cyclobenzaprine  (FEXMID ) 7.5 MG tablet Take 1 tablet (7.5 mg total) by mouth 2 (two) times daily as needed for muscle spasms. 01/31/24   Horton, Roxie HERO, DO  donepezil (ARICEPT) 10 MG tablet Take 10 mg by mouth at bedtime. 03/06/22   [provider]  glipiZIDE 2.5 MG TABS Take 1 tablet by mouth daily. 11/05/23   [provider]  Lancets Misc. (ACCU-CHEK FASTCLIX LANCET) KIT See admin instructions. 03/21/22   [provider]  LANTUS SOLOSTAR 100 UNIT/ML Solostar Pen Inject 15 Units into the skin daily.    [provider]  lidocaine  (HM LIDOCAINE  PATCH) 4 % Place 1 patch onto the skin daily. 02/08/24   Donnajean Lynwood DEL, PA-C  metFORMIN  (GLUCOPHAGE -XR) 500 MG 24 hr tablet Take 500 mg by mouth daily with breakfast.    [provider]  naproxen  (NAPROSYN ) 375 MG tablet Take 1 tablet (375 mg total) by mouth 2 (two) times daily. 01/31/24   Horton, Roxie HERO, DO  tamsulosin (FLOMAX) 0.4 MG CAPS capsule Take 0.4 mg by mouth.    [provider]    Allergies: Aspirin, Atorvastatin , Insulin  glargine, Metformin , and Amoxicillin    Review of Systems  Musculoskeletal:  Positive for back pain.    Updated Vital Signs BP (!) 174/82   Pulse (!) 55   Temp 97.8 F (36.6 C) (Oral)   Resp 18   Ht 5' 7 (1.702 m)   Wt 77.1 kg   SpO2 100%   BMI 26.63 kg/m   Physical Exam Vitals and nursing note reviewed.  Constitutional:      General: He is not in acute distress.    Appearance: Normal appearance.  HENT:     Head: Normocephalic and atraumatic.  Eyes:     Conjunctiva/sclera: Conjunctivae normal.  Cardiovascular:     Rate and Rhythm: Normal rate.  Pulmonary:     Effort: Pulmonary effort is normal. No respiratory distress.  Abdominal:  General: Bowel sounds are normal. There is no distension.     Palpations: Abdomen is soft.     Tenderness: There is no abdominal tenderness. There is no right CVA tenderness, left CVA tenderness, guarding or rebound.  Musculoskeletal:     Cervical back: No bony tenderness.     Thoracic back: Tenderness present. No bony tenderness.     Lumbar back: No tenderness or bony tenderness. Negative right straight leg raise test and negative left straight leg raise test.     Comments: Right paraspinous tenderness in thoracic region.  No overlying signs of infection nor ecchymosis  Skin:    Coloration: Skin is not jaundiced or pale.  Neurological:      Mental Status: He is alert and oriented to person, place, and time. Mental status is at baseline.     (all labs ordered are listed, but only abnormal results are displayed) Labs Reviewed  CBC WITH DIFFERENTIAL/PLATELET - Abnormal; Notable for the following components:      Result Value   Hemoglobin 12.0 (*)    HCT 37.7 (*)    All other components within normal limits  COMPREHENSIVE METABOLIC PANEL WITH GFR - Abnormal; Notable for the following components:   Glucose, Bld 340 (*)    All other components within normal limits  URINALYSIS, ROUTINE W REFLEX MICROSCOPIC - Abnormal; Notable for the following components:   Specific Gravity, Urine 1.036 (*)    Glucose, UA >=500 (*)    Protein, ur 30 (*)    All other components within normal limits    EKG: None  Radiology: No results found.   Medications Ordered in the ED  ketorolac  (TORADOL ) 15 MG/ML injection 15 mg (15 mg Intramuscular Given 04/26/24 1636)  cyclobenzaprine  (FLEXERIL ) tablet 5 mg (5 mg Oral Given by Other 04/26/24 1636)                                    Medical Decision Making Amount and/or Complexity of Data Reviewed Labs: ordered.  Risk Prescription drug management.    Patient presents to the ED for concern of right-sided back pain, this involves an extensive number of treatment options, and is a complaint that carries with it a high risk of complications and morbidity.  The differential diagnosis includes muscle strain, rib fracture, cauda equina, epidural abscess, UTI, kidney stone, pyelonephritis.   Co morbidities that complicate the patient evaluation  See HPI   Additional history obtained:  Additional history obtained from [facility], Nursing, and Outside Medical Records   External records from outside source obtained and reviewed including triage note, PCP note from 04/16/2024 for similar complaints of intermittent right back pain, ED note from 03/20/2024, 02/08/2024, 01/25/2024   Lab Tests:  I  Ordered, and personally interpreted labs.  The pertinent results include:   CBG 340 UA concentrated urine but without infection nor Hgb No leukocytosis Hgb 12     Medicines ordered and prescription drug management:  I ordered medication including Toradol , lidocaine  patch, Flexeril  for suspected muscle strain Reevaluation of the patient after these medicines showed that the patient improved I have reviewed the patients home medicines and have made adjustments as needed     Problem List / ED Course:  Right thoracic back pain without sciatica High suspicion for muscle strain, MSK, chronic back pain as etiology of pain as he has no urinary complaints, hematuria Shared decision making is had with patient regarding  obtaining CT imaging for kidney stone at this time.  He does have a history of a nonobstructing kidney stone on the left side that has not been causing him any flank pain or complications.  He has had 3 CT renal stone studies over the past 3 months showing the same nonobstructing stone without acute kidney stone.  I discussed that we can obtain labs, UA to see if there is any hematuria and provide him with analgesia to see if this improves pain before progressing to CT scan if needed as it seems that he continues to have right-sided back pain with normal scans over the past 3 months.  He agrees with this plan Provided Toradol , lidocaine  patches, Flexeril  for pain.  Patient's wife will be driving patient home.  Patient reports significant improvement of symptoms following analgesia in ED Low suspicion for emergent cause of symptoms to include epidural abscess, cauda equina with no red flag back pain symptoms.  No mid spinous tenderness, crepitus, masses palpated.  No fever no tachycardia here in emergency department.  No risk factors for either. Also provided Flexeril , meloxicam  prescription for pain.  Will have patient follow-up with PCP   Reevaluation:  After the interventions  noted above, I reevaluated the patient and found that they have :improved   Social Determinants of Health:  Former tobacco abuse Has PCP follow-up   Dispostion:  After consideration of the diagnostic results and the patients response to treatment, I feel that the patent would benefit from outpatient management symptomatic care.   Discussed ED workup, disposition, return to ED precautions with patient who expresses understanding agrees with plan.  All questions answered to their satisfaction.  They are agreeable to plan.  Discharge instructions provided on paperwork  Final diagnoses:  Chronic right-sided back pain, unspecified back location    ED Discharge Orders          Ordered    cyclobenzaprine  (FLEXERIL ) 10 MG tablet  2 times daily PRN        04/26/24 1820    meloxicam  (MOBIC ) 7.5 MG tablet  Daily        04/26/24 1820             Minnie Tinnie BRAVO, PA 04/26/24 2354    Laurice Maude BROCKS, MD 04/27/24 2317

## 2024-04-26 NOTE — Discharge Instructions (Addendum)
 Thank you for letting us  evaluate you today.  Your electrolytes are within normal limits.  You do not have an elevated white blood cell count to indicate inflammation, infection.  We have treated your pain with pain medicine here Emergency Department.  Also provided you with pain medicine prescription.  Continue to ice, heat area of pain.  You may also use topical pain medicine such as lidocaine  patches and Voltaren gel found over-the-counter for pain.  Please follow-up with primary care provider for follow-up  Return to Emergency Department if you experience urinary incontinence, incontinence of bowels, inability to feel genital region, numbness or weakness in your legs, fever with back pain with no other source of infection

## 2024-04-26 NOTE — ED Triage Notes (Signed)
 Pt presents to ED from home C/O chronic mid lower back pain. Ambulatory to triage with cane.
